# Patient Record
Sex: Female | Born: 1945 | Race: Black or African American | Hispanic: No | Smoking: Current every day smoker
Health system: Southern US, Community
[De-identification: ages and names within clinical notes are randomized; demographics above are authoritative.]

## PROBLEM LIST (undated history)

## (undated) DIAGNOSIS — C801 Malignant (primary) neoplasm, unspecified: Secondary | ICD-10-CM

## (undated) DIAGNOSIS — F0280 Dementia in other diseases classified elsewhere without behavioral disturbance: Secondary | ICD-10-CM

## (undated) DIAGNOSIS — G309 Alzheimer's disease, unspecified: Secondary | ICD-10-CM

## (undated) DIAGNOSIS — E119 Type 2 diabetes mellitus without complications: Secondary | ICD-10-CM

## (undated) DIAGNOSIS — F17200 Nicotine dependence, unspecified, uncomplicated: Secondary | ICD-10-CM

---

## 1997-08-02 ENCOUNTER — Other Ambulatory Visit: Admission: RE | Admit: 1997-08-02 | Discharge: 1997-08-02 | Payer: Self-pay | Admitting: *Deleted

## 1998-10-25 ENCOUNTER — Other Ambulatory Visit: Admission: RE | Admit: 1998-10-25 | Discharge: 1998-10-25 | Payer: Self-pay | Admitting: *Deleted

## 1999-06-24 ENCOUNTER — Emergency Department (HOSPITAL_COMMUNITY): Admission: EM | Admit: 1999-06-24 | Discharge: 1999-06-24 | Payer: Self-pay | Admitting: Emergency Medicine

## 1999-09-18 ENCOUNTER — Emergency Department (HOSPITAL_COMMUNITY): Admission: EM | Admit: 1999-09-18 | Discharge: 1999-09-18 | Payer: Self-pay | Admitting: Emergency Medicine

## 2000-01-16 ENCOUNTER — Other Ambulatory Visit: Admission: RE | Admit: 2000-01-16 | Discharge: 2000-01-16 | Payer: Self-pay | Admitting: *Deleted

## 2000-04-24 ENCOUNTER — Emergency Department (HOSPITAL_COMMUNITY): Admission: EM | Admit: 2000-04-24 | Discharge: 2000-04-24 | Payer: Self-pay | Admitting: *Deleted

## 2000-06-23 ENCOUNTER — Encounter (INDEPENDENT_AMBULATORY_CARE_PROVIDER_SITE_OTHER): Payer: Self-pay

## 2000-06-23 ENCOUNTER — Ambulatory Visit (HOSPITAL_COMMUNITY): Admission: RE | Admit: 2000-06-23 | Discharge: 2000-06-23 | Payer: Self-pay | Admitting: Gastroenterology

## 2001-03-17 ENCOUNTER — Other Ambulatory Visit: Admission: RE | Admit: 2001-03-17 | Discharge: 2001-03-17 | Payer: Self-pay | Admitting: *Deleted

## 2001-11-10 ENCOUNTER — Encounter: Payer: Self-pay | Admitting: Otolaryngology

## 2001-11-10 ENCOUNTER — Encounter: Admission: RE | Admit: 2001-11-10 | Discharge: 2001-11-10 | Payer: Self-pay | Admitting: Otolaryngology

## 2001-11-18 ENCOUNTER — Encounter: Payer: Self-pay | Admitting: Otolaryngology

## 2001-11-18 ENCOUNTER — Encounter: Admission: RE | Admit: 2001-11-18 | Discharge: 2001-11-18 | Payer: Self-pay | Admitting: Otolaryngology

## 2001-12-01 ENCOUNTER — Ambulatory Visit (HOSPITAL_BASED_OUTPATIENT_CLINIC_OR_DEPARTMENT_OTHER): Admission: RE | Admit: 2001-12-01 | Discharge: 2001-12-01 | Payer: Self-pay | Admitting: Otolaryngology

## 2001-12-01 ENCOUNTER — Encounter (INDEPENDENT_AMBULATORY_CARE_PROVIDER_SITE_OTHER): Payer: Self-pay | Admitting: *Deleted

## 2002-01-19 ENCOUNTER — Ambulatory Visit (HOSPITAL_COMMUNITY): Admission: RE | Admit: 2002-01-19 | Discharge: 2002-01-19 | Payer: Self-pay | Admitting: Otolaryngology

## 2002-01-19 ENCOUNTER — Encounter: Payer: Self-pay | Admitting: Otolaryngology

## 2002-03-03 ENCOUNTER — Encounter: Payer: Self-pay | Admitting: Urology

## 2002-03-03 ENCOUNTER — Encounter: Admission: RE | Admit: 2002-03-03 | Discharge: 2002-03-03 | Payer: Self-pay | Admitting: Urology

## 2002-04-12 ENCOUNTER — Encounter: Payer: Self-pay | Admitting: Surgery

## 2002-04-12 ENCOUNTER — Ambulatory Visit (HOSPITAL_COMMUNITY): Admission: RE | Admit: 2002-04-12 | Discharge: 2002-04-12 | Payer: Self-pay | Admitting: Surgery

## 2002-05-04 ENCOUNTER — Other Ambulatory Visit: Admission: RE | Admit: 2002-05-04 | Discharge: 2002-05-04 | Payer: Self-pay | Admitting: *Deleted

## 2002-07-28 ENCOUNTER — Ambulatory Visit (HOSPITAL_COMMUNITY): Admission: RE | Admit: 2002-07-28 | Discharge: 2002-07-28 | Payer: Self-pay | Admitting: Surgery

## 2002-07-28 ENCOUNTER — Encounter: Payer: Self-pay | Admitting: Surgery

## 2002-08-31 ENCOUNTER — Inpatient Hospital Stay (HOSPITAL_COMMUNITY): Admission: RE | Admit: 2002-08-31 | Discharge: 2002-09-07 | Payer: Self-pay | Admitting: Surgery

## 2002-08-31 ENCOUNTER — Encounter: Payer: Self-pay | Admitting: Surgery

## 2002-08-31 ENCOUNTER — Encounter (INDEPENDENT_AMBULATORY_CARE_PROVIDER_SITE_OTHER): Payer: Self-pay | Admitting: Specialist

## 2003-01-12 ENCOUNTER — Ambulatory Visit (HOSPITAL_COMMUNITY): Admission: RE | Admit: 2003-01-12 | Discharge: 2003-01-12 | Payer: Self-pay | Admitting: Surgery

## 2003-01-13 ENCOUNTER — Ambulatory Visit (HOSPITAL_COMMUNITY): Admission: RE | Admit: 2003-01-13 | Discharge: 2003-01-13 | Payer: Self-pay | Admitting: Surgery

## 2003-06-06 ENCOUNTER — Other Ambulatory Visit: Admission: RE | Admit: 2003-06-06 | Discharge: 2003-06-06 | Payer: Self-pay | Admitting: *Deleted

## 2004-02-03 ENCOUNTER — Ambulatory Visit (HOSPITAL_COMMUNITY): Admission: RE | Admit: 2004-02-03 | Discharge: 2004-02-03 | Payer: Self-pay

## 2005-03-29 ENCOUNTER — Encounter: Admission: RE | Admit: 2005-03-29 | Discharge: 2005-03-29 | Payer: Self-pay

## 2005-11-16 ENCOUNTER — Emergency Department (HOSPITAL_COMMUNITY): Admission: EM | Admit: 2005-11-16 | Discharge: 2005-11-16 | Payer: Self-pay | Admitting: Emergency Medicine

## 2007-01-02 ENCOUNTER — Encounter: Admission: RE | Admit: 2007-01-02 | Discharge: 2007-01-02 | Payer: Self-pay

## 2007-10-08 ENCOUNTER — Inpatient Hospital Stay (HOSPITAL_COMMUNITY): Admission: EM | Admit: 2007-10-08 | Discharge: 2007-10-14 | Payer: Self-pay | Admitting: Emergency Medicine

## 2007-10-08 ENCOUNTER — Ambulatory Visit: Payer: Self-pay | Admitting: Pulmonary Disease

## 2007-10-14 ENCOUNTER — Ambulatory Visit: Payer: Self-pay | Admitting: Vascular Surgery

## 2007-10-14 ENCOUNTER — Encounter (INDEPENDENT_AMBULATORY_CARE_PROVIDER_SITE_OTHER): Payer: Self-pay | Admitting: Internal Medicine

## 2007-11-30 ENCOUNTER — Encounter: Admission: RE | Admit: 2007-11-30 | Discharge: 2007-11-30 | Payer: Self-pay | Admitting: Endocrinology

## 2008-07-18 ENCOUNTER — Encounter: Admission: RE | Admit: 2008-07-18 | Discharge: 2008-07-18 | Payer: Self-pay

## 2009-07-13 ENCOUNTER — Encounter: Admission: RE | Admit: 2009-07-13 | Discharge: 2009-07-13 | Payer: Self-pay | Admitting: General Surgery

## 2010-05-05 IMAGING — CR DG ABD PORTABLE 1V
1 series · 1 of 1 positions shown · non-contrast
Comparison: 11/08/2007, [DATE] hours.

CLINICAL DATA: Status epilepticus.  Altered level of consciousness,
fever.  Hyperglycemia.

ABDOMEN - 1 VIEW

[view not recorded]
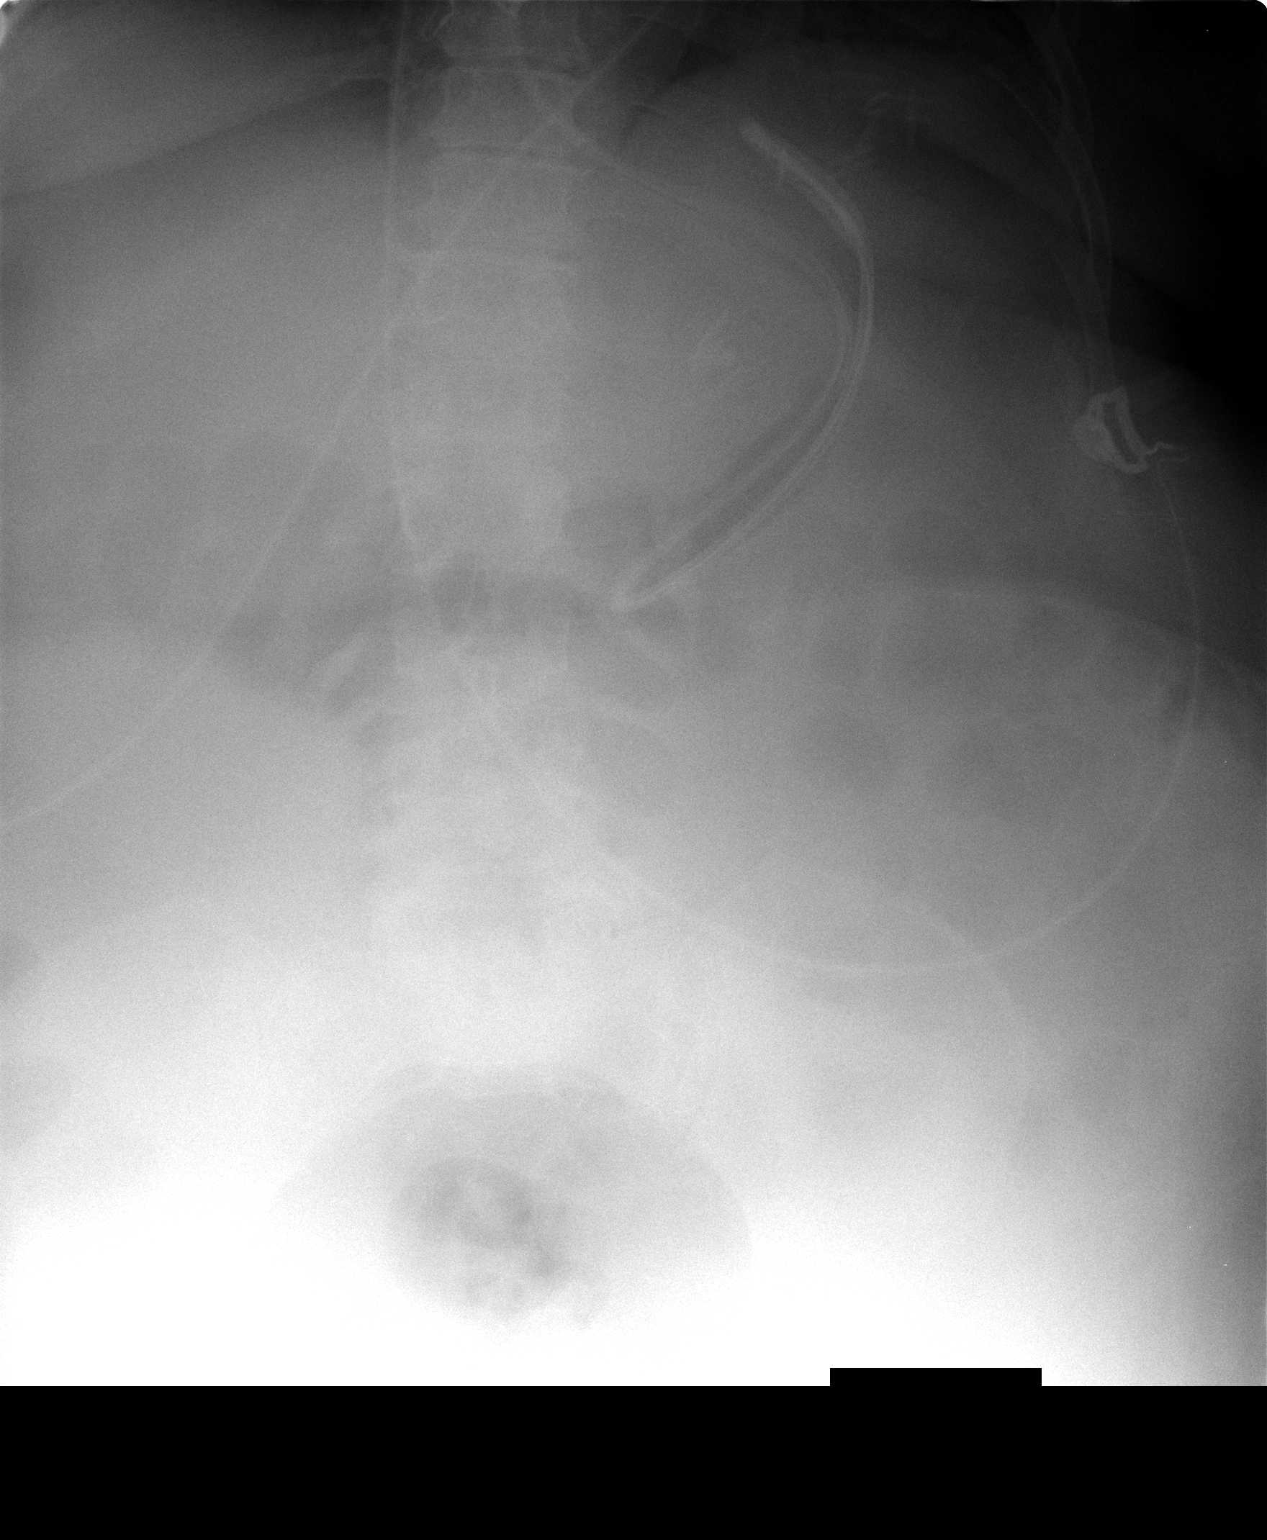

[1 of 1 positions shown; findings below may reference images not displayed]

FINDINGS: The feeding tube has been manipulated but the tip still
is directed at the gastric cardia, with the tube doubled back upon
itself within the stomach.
IMPRESSION: Feeding tube in stomach, with tip at gastric cardia.

## 2010-05-05 IMAGING — CR DG CHEST 1V PORT
1 series · 1 of 1 positions shown · non-contrast
Comparison: 10/11/2007

CLINICAL DATA: Status epilepticus, altered consciousness, fever,
hyperglycemia.

PORTABLE CHEST - 1 VIEW

[view not recorded]
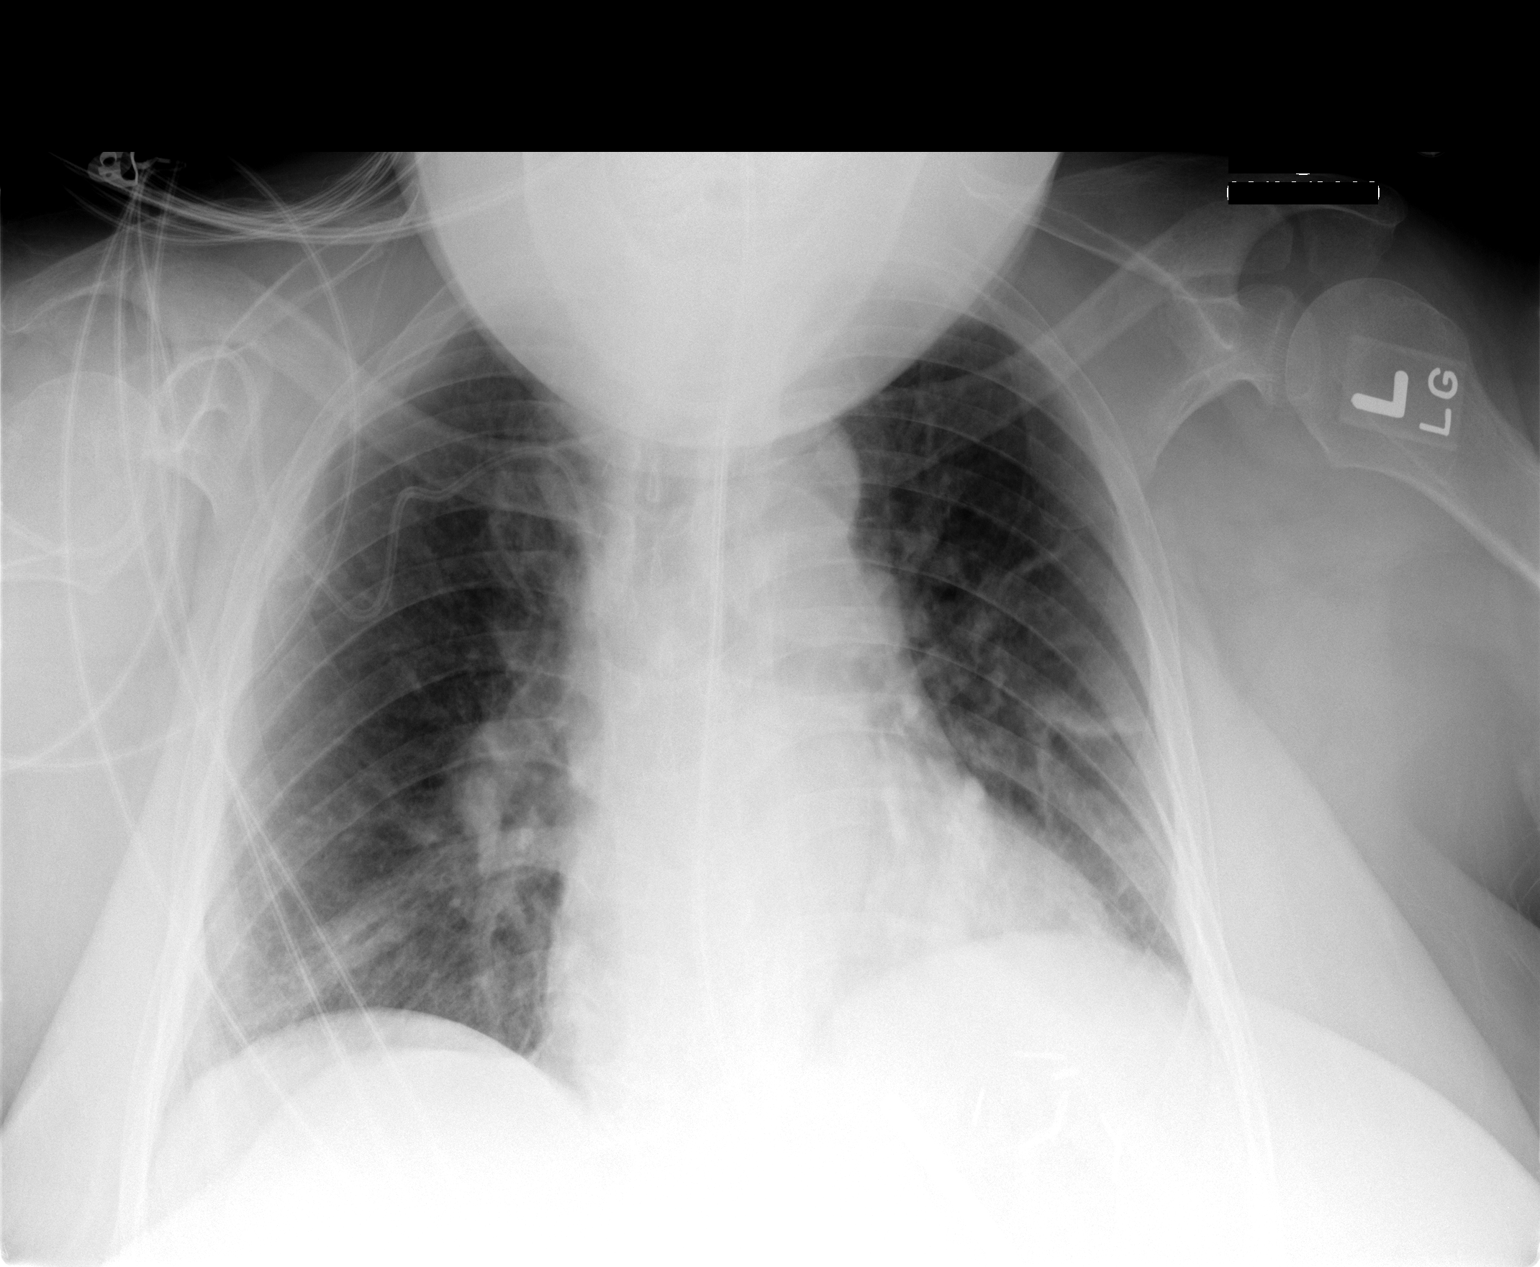

[1 of 1 positions shown; findings below may reference images not displayed]

FINDINGS: Feeding tube is present with the tip in the stomach.
Surgical changes noted in the left upper quadrant.  Scattered areas
of subsegmental atelectasis.  Right subclavian central line is
present with the tip in the mid SVC, unchanged from yesterday's
exam.  The endotracheal tube appears to been removed.  Exam is
mildly limited by projection.
IMPRESSION: 1.  Interval extubation.
2.  Low lung volumes.
3.  Bibasilar atelectasis.
4.  Feeding tube in stomach and right subclavian line unchanged.

## 2010-07-24 NOTE — H&P (Signed)
NAMESANJANA, FOLZ NO.:  [id number]   MEDICAL RECORD NO.:  [id number]          PATIENT TYPE:  INP   LOCATION:  1228                         FACILITY:  [facility]   PHYSICIAN:  Oley Balm. Sung Amabile, MD   DATE OF BIRTH:  1945/03/13   DATE OF ADMISSION:  10/08/2007  DATE OF DISCHARGE:                              HISTORY & PHYSICAL   ADMISSION DIAGNOSES:  1. Status epilepticus with decreased level of consciousness.  2. Ventilator-dependent respiratory failure.  3. Hyperglycemic, hyperosmolar state.  4. History of meningioma.  5. Fever, unclear etiology.  6. History of systemic lupus erythematous - no chronic prednisone      since February 2009.   HISTORY OF PRESENT ILLNESS:  Nancy Bailey is a 65 year old woman with a  complex past medical history.  Most relevant, she had a meningioma  diagnosed in 1994 and underwent craniotomy for resection of this at that  time.  The resection was incomplete and she also received radiation  therapy.  She subsequently had further surgery in approximately year  1996.  She has been followed closely at [facility]  with repeat CT scans of the head every 6 to 12 months since that time.  Her oncologist at [facility] is Dr. Amada Jupiter.  She was seen most  recently 2 days ago with an MRI performed at that time per the family.  The family informs me that Dr. Amada Jupiter contacted the patient and  told her that the MRI demonstrated no significant change in the  meningioma since prior studies.  The patient has suffered visual change  and headaches over the past couple of weeks.  She has had no fevers,  chills or sweats.  On the afternoon of this admission, she was in her  usual state of health.  Subsequently, no family members could contact  her.  Ultimately she was found with depressed level of consciousness and  EMS was dispatched to her home.  Per their reports she was found to be  seizing.  She was transported to  [facility] Emergency  Department where she was intubated for depressed level of consciousness.  She received anticonvulsants in the emergency department.  She was found  to be febrile at 102 degrees Fahrenheit and received ceftriaxone for  this.  She was found to be severely hyperglycemic and hyperosmolar and  was started on an insulin drip for this.   PAST MEDICAL HISTORY:  1. Most notable for meningioma as described above.  2. Reported history of lupus, followed by Dr. Phylliss Bob.  She has been on      prednisone much of the past decade or so.  The prednisone was      tapered off in February of this year, though she has had a couple      of Dosepaks since that time.  3. Hypertension.  4. 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  5. Chronic arthritis.  6. There is no prior history of diabetes.   SOCIAL HISTORY:  The patient is a  nonsmoker.  She lives independently.  She has suffered a recent loss of her daughter and has been notably  despondent over this since April of this year.  She has no history of  significant occupational or environmental exposures.   FAMILY HISTORY:  Noncontributory.   REVIEW OF SYSTEMS:  Per the family, there have been no fevers, chills,  sweats.  No nausea, vomiting or diarrhea.  No dysuria.  No change in  appetite.  No lower extremity edema or calf tenderness beyond her usual  mild fluid retention.   PHYSICAL EXAMINATION:  Temperature 102.0, blood pressure 105/60, pulse  80 and regular, respirations 18 on the ventilator.  Oxygen saturation  100%.  GENERAL:  In general, she is markedly obese, intubated and minimally  responsive.  HEENT: Reveals pinpoint pupils that react to light.  Otherwise  unremarkable.  NECK:  Supple without palpable adenopathy or thyromegaly.  Jugular  venous pulsations cannot be visualized.  CHEST:  Examination reveals diffuse scattered wheezes anteriorly.  No  findings of  consolidation are noted.  CARDIAC:  Exam reveals regular rate and rhythm with no murmurs.  ABDOMEN:  Markedly obese, soft, nontender with normal bowel sounds.  There is a well-healed surgical scar across the upper abdomen.  EXTREMITIES:  Reveal no clubbing, cyanosis or edema.  Distal pulses are  full and capillary refill is normal.   DATA:  This has been reviewed and is most notable for severe  hypernatremia and hyperglycemia.  Acid base status is normal.  Potassium  is 6.0.  CBC suggests that she is hemoconcentrated.  Chest x-ray reveals  no acute cardiac or pulmonary disease.  Central line placement is good  without pneumothorax.  Endotracheal tube has been repositioned and the  tip is in the mid trachea.   IMPRESSION:  1. Status epilepticus with altered mental status - she is receive      Dilantin load.  There does not appear to be ongoing seizure.  I      have requested that Neurology see her in consultation.  The      etiology of seizure, which is new onset, is uncertain, but most      likely is related to her prior meningioma and perhaps related to      profound metabolic disturbance and hyperosmolar state.  Although      she is febrile in the emergency department, I have a low index of      suspicion for meningitis.  I suspect the fever is postictal, based      on the absence of compelling history for meningitis prior to the      onset of this seizure.  2. Severe hyperglycemia and hyperosmolarity - no prior history of      diabetes, though given her body habitus and given the fact that she      has been on chronic steroids, I find it difficult to believe that      she has not had problems with hyperglycemia for some time and that      this problem was simply not detected.  3. Hyperkalemia - again this is related to medical therapy (she was      taking a potassium supplement), renal insufficiency and metabolic      disturbance.  This should resolve with insulin therapy and  volume      resuscitation.  4. Ventilator-dependent respiratory failure.  5. Morbid obesity.   PLAN:  She will be admitted to the intensive care unit  for ventilatory  support.  Dr. Vickey Huger, of Neurology has seen the patient to direct  neurologic evaluation and management.  She has already been loaded with  Dilantin.  We will use propofol for sedation to allow for daily wake up  assessment and neurologic evaluation.  In addition, I have written for  p.r.n. Ativan in the event of further seizure.  She was started on an  insulin drip in the emergency department and this will be continued per  the glucose stabilizer protocol.  Empiric antibiotics, Unasyn have been  initiated.  I failed to mention above that her MRI performed at [facility]  reportedly did demonstrate changes of sinusitis.  There is no evidence  of aspiration pneumonia on the current chest x-ray and history and  physical exam do not suggest any other infectious sources of fever.  Ultimately, the family wishes for her to be stabilized and transferred  to [facility], where she has received most of her care under the guidance of  Dr. Amada Jupiter, the Oncologist.  Of note, she was scheduled to be seen  by him in followup of the recent MRI on the day following this  admission.  I made it  clear to the family that it is often difficult to transport critically  ill the patient's and that it might be a couple or a few days before we  can make that transfer.  Lastly, I have initiated DVT prophylaxis with  mini dose heparin and stress ulcer prophylaxis with Protonix.      Oley Balm Sung Amabile, MD  Electronically Signed     DBS/MEDQ  D:  10/08/2007  T:  10/08/2007  Job:  [id number]

## 2010-07-24 NOTE — Op Note (Signed)
NAMEGEORGANA, ROMAIN NO.:  [id number]   MEDICAL RECORD NO.:  [id number]          PATIENT TYPE:  INP   LOCATION:  1228                         FACILITY:  [facility]   PHYSICIAN:  Oley Balm. Sung Amabile, MD   DATE OF BIRTH:  Mar 19, 1945   DATE OF PROCEDURE:  10/08/2007  DATE OF DISCHARGE:                               OPERATIVE REPORT   PROCEDURE:  Right subclavian central venous catheter placement.   INDICATION:  Vent-dependent respiratory failure with poor intravenous  access.   CONSENT:  This was obtained verbally from the family members.   PROCEDURE:  After obtaining verbal consent from family members, this  procedure was performed in the [facility] emergency room.  The patient  was prepped and draped in sterile manner using a full body drape.  Full  barrier precautions were undertaken through the course of the procedure.  The skin was cleaned with chlorhexidine swabs.  One percent lidocaine  was used for topical anesthesia.  An Arrow 30-cm triple-lumen catheter  was utilized.  The right subclavian vein was accessed on the first  stick, but on several attempts, the wire could not be passed and  appeared to be running up the IJ.  After 3 attempts at a median  approach, a lateral approach was undertaken, and the subclavian vein was  accessed with the introducer needle.  The wire was passed without  obstruction.  Seldinger technique was used to place the catheter over  the wire.  The catheter was sutured at 18 cm.  There was good blood  return in all 3 lumens.  The lumens were flushed with normal saline.  Follow-up chest x-ray demonstrated no pneumothorax and proper  positioning of the central venous catheter.      Oley Balm Sung Amabile, MD  Electronically Signed     DBS/MEDQ  D:  10/08/2007  T:  10/08/2007  Job:  [id number]

## 2010-07-24 NOTE — Discharge Summary (Signed)
[name], Nancy Bailey NO.:  [id number]   MEDICAL RECORD NO.:  [id number]          PATIENT TYPE:  INP   LOCATION:  1519                         [facility]ACILITY:  [facility]   PHYSICIAN:  Charlaine Dalton. Sherene Sires, MD, FCCPDATE OF BIRTH:  05-31-1945   DATE OF ADMISSION:  10/08/2007  DATE OF DISCHARGE:  10/14/2007                               DISCHARGE SUMMARY   ADDENDUM   DISCHARGE DIAGNOSES:  1. Altered mental status secondary to severe metabolic derangement,      hyperosmolar nonketotic coma, and seizure.  2. Hypertension.  3. Acute respiratory failure secondary to altered mental status      secondary to severe metabolic derangement, hyperosmolar nonketotic      coma, and seizure.  4. Profound hypernatremia with acute renal failure (resolved).  5. Obesity.  6. Diabetes type 2.  7. Left arm pain.   As mentioned in the prior discharge summary, Nancy Bailey complained of  left arm pain initiating at the wrist of the left arm.  Circulation and  motion were intact.  There was mild ecchymosis.  There was otherwise no  significant discoloration.  The pulses were strong.  There was brisk cap  refill distal to old arterial insertion site.  At any rate, we obtained  ultrasound imaging of the arterial and venous circulation to rule out  evidence of clot and hematoma.  The findings of this test demonstrated a  large thrombus initiating in the left cephalic vein at the level of the  wrist, extending up to the axilla.  The arterial circulation was noted  to be intact.  In light of these findings it was felt Nancy Bailey was  safe to be discharged to home with instructions to continue current  level of activity, elevate arm at rest.  Anticoagulation was deferred at  this point given 1) The patient's desire to go home and 2) A history of  known meningioma with a mild degree of edema.  We were concerned about  long-term anticoagulation with risk of intercerebral hemorrhage.  Therefore, will defer  this potential therapy to Dr. Amada Jupiter at [facility].  Upon time of discharge, again, Nancy Bailey  has met maximum benefit from inpatient care.  All other  discharge diagnoses are as mentioned above.  Again, will defer treatment  of venous thrombus to her oncology physician, particularly given our  concern for risk of hemorrhage, and low risk for pulmonary emboli from  upper extremity DVT.      Zenia Resides, NP      Charlaine Dalton. Sherene Sires, MD, [facility]  Electronically Signed    PB/MEDQ  D:  10/14/2007  T:  10/14/2007  Job:  [id number]   cc:   Elwyn Reach [facility]  [phone]

## 2010-07-24 NOTE — Discharge Summary (Signed)
Nancy Bailey, Nancy Bailey NO.:  [id number]   MEDICAL RECORD NO.:  [id number]          PATIENT TYPE:  INP   LOCATION:  1519                         FACILITY:  [facility]   PHYSICIAN:  Nancy Dalton. Sherene Sires, MD, FCCPDATE OF BIRTH:  17-Mar-1945   DATE OF ADMISSION:  10/08/2007  DATE OF DISCHARGE:  10/14/2007                               DISCHARGE SUMMARY   DISCHARGE DIAGNOSES:  1. Altered mental status secondary to severe metabolic derangements,      hyperosmolar nonketotic coma, and seizure.  2. Hypertension.  3. Acute respiratory failure secondary to #1.  4. Profound hypernatremia, with acute renal failure.  5. Obesity.  6. Diabetes type 2.   LABORATORY DATA UPON TIME OF DISCHARGE:  Date October 14, 2007:  Sodium  141, potassium 3.8, chloride 108, CO2 26, fasting glucose 228, BUN 8,  creatinine 0.79, calcium 8.7.  Date October 13, 2007:  Complete blood  count:  White blood cell count 9.4, hemoglobin 12.1, hematocrit 37,  platelet count 165.   CT of head obtained on October 08, 2007 demonstrates tumor of left ethmoid,  frontal, and orbital regions which may represent recurrent meningioma or  type of neoplasm.  The tumor is causing visible bony destruction and  extends into the orbit.  Also suggestion of subtle cerebral edema in the  left temporal and parietal lobes.  EEG obtained on October 09, 2007  demonstrates abnormal EEG with sharp transients emanating from the left  greater than right frontotemporal regions.  The recording suggests  irritability of the brain, with lower seizure threshold, no actual  epileptic discharges were noted.   BRIEF HISTORY:  A 65 year old woman with a complex medical history,  including meningioma diagnosed in 1994, followed at [facility] with Dr. Amada Bailey.  The patient had been complaining  of some visual changes and headaches for the last couple weeks, as well  as increased sleepiness.  No fevers, no chills or sweats.  The  afternoon  of admission, she was in her usual state of health.  Subsequently, she  was found by family after they were unable to contact her with altered  mental status.  The EMS was dispatched to her home.  Their report found  the patient to be actively seizing.  She was therefore transferred  emergently to [facility] emergency room.  Given her altered mental  status, she was emergently intubated for airway protection.  She was  found to have fever of 102 degrees Fahrenheit.  For this, she received  empiric ceftriaxone.  She was also found to be severely hyperglycemic  and hyperosmolar on initial laboratory data, with blood glucose above  800, an initial sodium of 158, with a serum osmolality of 369.  She was  given a loading dose of Dilantin, and transferred to the intensive care  for definitive therapy.   HOSPITAL COURSE:  1. Altered mental status, with severe toxic metabolic encephalopathy      in the setting of severe metabolic derangements, postictal state      following seizure, in the setting of hyperosmolar nonketotic coma.  As previously mentioned, Nancy Bailey was admitted to the intensive      care on full mechanical ventilation following witnessed seizure by      EMS.  Because of this, she was placed on mechanical ventilation for      airway protection.  A CT of the head was obtained, with the above      findings noted.  The patient was placed on anticonvulsant therapy.      Neurology was consulted.  She was initially placed on Dilantin, but      later transitioned to Keppra at the discretion of neurology      services.  An EEG was obtained, with the results noted above.  A      dialogue with Dr. Amada Bailey at [facility] was      obtained for further background information.  Ultimately, the      patient's mental status rapidly improved, along with correction of      hypernatremia and fluid volume status.  She was found to have a      profound  hyperosmolar state in the setting of dehydration and      hyperglycemia.  Her glycemic control was aggressively attended to,      and ultimately her mental status returned to baseline.  Upon time      of discharge, she will be discharged to home on 1 mg Decadron daily      in the setting of known cerebral edema, Keppra twice daily at 750      mg, will defer further dosing and management at the discretion of      [facility], and instructions to no longer drive      for minimally the next 6 months, given recent seizure activity.  2. Acute respiratory failure secondary to problem #1.  The patient was      placed on mechanical ventilation.  This was initiated on September 11, 2007.  She was successfully extubated on October 11, 2007.  Her      breath sounds are clear upon time of discharge, her x-ray is      without abnormality, and she has had no pulmonary sequelae from      this.  No specific followup recommended.  3. Profound hypernatremia secondary to free water deficit, and      resultant renal failure.  For this, Nancy Bailey received aggressive      volume resuscitation, and free water supplementation.  Upon time of      discharge, her sodium has been regulated, as has her serum      creatinine normalized.  She will now be discharged to home on an      aggressive twice-daily basal insulin therapy regimen, which should      assist with further risk of HHNK.  4. Diabetes type 2, with hyperosmolar nonketotic coma.  This is as      noted above.  Nancy Bailey did present with a glucose above 800.  Her      glucose fasting is now 225.  No doubt, this is exacerbated by      dosing of Decadron.  She is obese, does have room for lifestyle      adjustments, and continued dietary alteration.  Extensive diabetes      education was undergone.  Given the patient's visual deficits, she      will be to send home with a SoloStar Lantus pen  with twice-daily      Lantus dosing.  She was  instructed to check blood glucose 4 times a      day and record this.  She will need further titration of and her      insulin should she require persistent Decadron dosing.  5. Left arm pain.  The left arm at the radial insertion site of an      arterial catheter is tender to palpation.  She does have      circulation and motion intact.  She is able to move.  There is no      significant discoloration, and there are strong pulses, with brisk      capillary refill distal to site.  At any rate, given the patient      did have arterial catheterization in the left radial artery, we      will obtain ultrasound imaging of the arterial circulation, as well      as venous circulation to rule out evidence of clot or hematoma.   DISCHARGE INSTRUCTIONS:  1. Tramadol 50 mg 4 times a day as needed.  2. Singulair 10 mg daily.  3. Robaxin 500 mg q.6 h. as needed.  4. Fexofenadine 180 mg daily.  5. Vesicare 10 mg daily.  6. Decadron 1 mg tablet daily.  7. Toprol-XL 25 mg tablet daily.  8. Lantus insulin 20 units injected via SoloStar pen q.12 h.,      instructed to take one dose in the morning and one in the night.  9. Keppra 750 mg p.o. twice daily.   FOLLOWUP:  1. She is instructed see Dr. Amada Bailey in the next 2 days at [facility].  The patient will set this up.  2. Additionally she was instructed to see her primary care Harini Dearmond,      Dr. Phylliss Bob, here in [place] in the next 2 weeks, and the patient      states she will and arrange this appointment as well.   ACTIVITY:  The patient was instructed that she should not drive for  minimally the next 6 months, and this can be reevaluated following  prolonged time without recurrent seizure.  She was instructed to eat a  low-sodium, no concentrated sweets diet.  She was also instructed to  avoid refined sugars.  She was instructed she can increase activity as  tolerated, and to report to the nearest emergency room should  above  symptoms recur.   DISPOSITION:  Ms. Swetz has met maximum benefit from inpatient stay.  She will be discharged to home with the followups as noted.      Zenia Resides, NP      Nancy Dalton. Sherene Sires, MD, [facility]  Electronically Signed    PB/MEDQ  D:  10/14/2007  T:  10/14/2007  Job:  [id number]   cc:   Areatha Keas, M.D.  Fax: [phone]   Dr., Kateri [facility] [place]  Phone [phone]

## 2010-07-24 NOTE — Procedures (Signed)
EEG#:  [id number]   HISTORY:  This is a 65 year old patient presenting with status  epilepticus, altered mental status, fever, hyperglycemia, and  hyperosmolar state with a history of meningioma, status post craniotomy,  and history of lupus.  The patient is being evaluated for the seizures.   CURRENT MEDICATIONS:  Cipro, Decadron, Ventolin inhaler, Lantus insulin,  Atrovent, Protonix, Dilantin, vancomycin, fentanyl, Apresoline, Ativan,  and Diprivan.   This is a portable EEG recording.  Surgical scar on the head noted on  the left frontotemporal area.   EEG classification, dysrhythmia grade 2, left greater than right  frontotemporal.   DESCRIPTION OF RECORDING:  Background rhythm of this recording consists  of a moderately well modulated medium amplitude 9 Hz background activity  that is reactive.  As the record progresses, photic stimulation and  hyperventilation were not performed.  The patient appears to have  rudimentary sleep spindles seen at times consistent with sedated sleep  and some overlying beta frequency activity is also noted.  The patient  occasionally will have sharp transients emanating from the left greater  than right frontotemporal area.  At no time during recording did there  appear to be evidence of actual spike or spike wave discharges or  persistent slowing.  EKG monitor shows no evidence of cardiac rhythm  abnormalities; heart rate of 102.   IMPRESSION:  This is an abnormal EEG recording due to sharp transients  emanating from the left greater than right frontotemporal regions.  This  recording suggests some irritability of the brain with a lowered seizure  threshold, although no actual epileptic discharges were noted.      Marlan Palau, M.D.  Electronically Signed     [id number]  D:  10/09/2007 14:44:15  T:  10/10/2007 05:42:28  Job #:  [id number]

## 2010-07-24 NOTE — Consult Note (Signed)
NAMEHARRY, BARK NO.:  [id number]   MEDICAL RECORD NO.:  [id number]          PATIENT TYPE:  INP   LOCATION:  0108                         FACILITY:  [facility]   PHYSICIAN:  Melvyn Novas, M.D.  DATE OF BIRTH:  Jul 11, 1945   DATE OF CONSULTATION:  10/08/2007  DATE OF DISCHARGE:                                 CONSULTATION   Dr. Sandrea Hughs is the attending.   PRIMARY CARE PHYSICIAN:  Dr. Phylliss Bob, rheumatology.   She presents on October 08, 2007 at 5:30 in status epilepticus.   The patient is a 65 year old African American female with a history of  resected and recurrent meningioma who was also followed for lupus  erythematosus by Dr. Phylliss Bob.  She is to be on chronic steroids but in  April of this year weaned off and has been followed intermittently,  apparently requiring some Dosepak.  She has seen further Dr. Bjorn Pippin.  She has seen Darnell Level, Careers adviser, for retrogastric peritoneal mass in  2004.  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  She  has a history of these meningiomata for over 12 years.  A recurrence  occurred at the end of last year, and she has since then had fractioned  radiation and stereotactic radiosurgery.   Ms. Soave is described by her family as a pleasant well-appearing 65-  year-old [place] American female in no acute distress but morbidly  obese.  She was hypertensive during her last visit in June of this year  at the [facility], August 14, 2007, with a blood pressure of  155/81.  Her weight was 123 kg at a size of 5 feet 2 inches.  The patient has a history of left periorbital edema and proptosis  related to her meningioma.  She presented here in status epilepticus and  was taken by EMS to the emergency room at [facility].  Apparently, [facility] ER today is not able to assist more  patients, even if their chief complaint  is neurologic.  The patient's  family states that she developed over the last 2 months some personality  changes.  She has been perseverating.  She seems to forget a lot.  She  appears slightly disoriented.  She describes that she feels always she has a dry mouth, has severe  thirst and had polyuria over the last month.  She also had blurred  vision.  Her physician at [facility] just saw her last week for an eye examine, her  daughter states, and she states that she was told that she had healthy  eyes to her family members.  Today, when she did not respond to initial  IV benzodiazepines and then received Dilantin IV, she required  intubation after the repeated doses of benzodiazepines were given and is  now on the critical care team.  The consult was called at 8 p.m. by Dr.  Sung Amabile to see the patient for further seizure management.    The patient is also severly hyperglycemic at 847ng/ml.  This is a new  finding as the patient was supposingly never diagnosed with diabetes..  The patient appeared restless and needed sedation to protect her airway  and the intubation.  A CT of the head was obtained and apparently  negative for bleed.  The CT was a noncontrast study.   Further laboratory results today show a white blood cell count 7.4, a  hemoglobin and hematocrit of 15.8 and 47.8, a platelet count showed  platelet clumps probably due to hyperosmolality, and therefore, it was  not corrected.  Sodium 149, potassium 6, chloride 114, carbon dioxide  27, BUN was 29, creatinine was 1.5, calcium was 9.3.  Salicylates were  not found.  A tox screen was not performed.  The patient is a full code.   Endotracheal tube was in the right mainstem bronchus.  Lungs are clear  to auscultation.  The patient has not aspirated.  She is obese but does  not show peripheral edema.  She does not show clubbing or cyanosis.  She  is not diaphoretic.  Vital signs are relatively stable with a blood  pressure now of  150/78.  Heart rate is still 110, but in normal sinus  rhythm.  There is no cardiac arrhythmia noticed.  Respiratory rate was  18, breathing above the vent rate as set.   FAMILY HISTORY:  The patient just lost her daughter in April of this  year.  I do not know if this was due to an accident or a long-time  illness.  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  The family  here at the bedside is apparently nephews, nieces and a sister.   The patient received Dilantin, phenytoin, a load of 1.8 g IV.  This is  not fosphenytoin by the way.  Medications further included multiple  doses of Ativan, finally propofol and phosphenitoin, initially to  facilitate the intubation.  Upon the request of the family, the  neurosurgeon, Dr. Petra Kuba, at [facility] was called and  stated that if the family wants the patient to be transferred he would  be accepting the patient if stabilized.   The patient is unconscious.  She is not following commands.  She is  moving her arms.  She is trying to fight the vent.  She is biting the  tube.  She is atraumatic.  She has no scleral icterus.  No carotid bruit.  Deep tendon reflexes are  attenuated.  She has bilateral upgoing toes here today.  Again, sensory  exam, coordination exam, or gait and station had of course to be  deferred.   ASSESSMENT:  Status epilepticus, currently controlled under Dilantin.  At the time I was called to the patient's bedside, she was no longer  actively seizing but is intubated and sedated.   PLAN:  Continue vent care for the night, probably extubate tomorrow  morning.  The patient should be continued on Dilantin as a maintenance  dose of 100 mg IV t.i.d. as long as she cannot take p.o. medication.  Otherwise, she can be converted to p.o.  She will be on a Glucomander.  The critical care ICU orders were written by Dr. Sung Amabile for the primary  admitting team.  Follow-up will be provided tomorrow on October 09, 2007 by  our P.A. and physician on call.      Melvyn Novas, M.D.  Electronically Signed     CD/MEDQ  D:  10/08/2007  T:  10/08/2007  Job:  [id number] 

## 2010-07-27 NOTE — Op Note (Signed)
NAME:  Nancy Bailey, ROANHORSE NO.:  [id number]   MEDICAL RECORD NO.:  [id number]                   PATIENT TYPE:  OUT   LOCATION:  XRAY                                 FACILITY:  [facility]   PHYSICIAN:  Velora Heckler, M.D.                DATE OF BIRTH:  07-09-1945   DATE OF PROCEDURE:  08/31/2002  DATE OF DISCHARGE:  07/28/2002                                 OPERATIVE REPORT   PREOPERATIVE DIAGNOSIS:  Cystic neoplasm of the pancreas.   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   OPERATION PERFORMED:  Exploratory laparotomy with left adrenalectomy.   SURGEON:  Velora Heckler, M.D.   ASSISTANT:  Gita Kudo, M.D.   ANESTHESIA:  General per Dr. Kipp Brood.   ESTIMATED BLOOD LOSS:  .   PREPARATION:  Betadine.   COMPLICATIONS:  None.   INDICATIONS FOR PROCEDURE:  The patient is a 65 year old black female who  was seen initially in January of 2004 for cystic neoplasm of the pancreas.  This was found incidentally when CT scan of the abdomen was obtained as a  work-up for microhematuria.  The patient's surgical exploration was delayed  due to the need for treatment for recurrent meningioma at [facility].  The patient was cleared for surgery in early June and is  now brought to the operating room for exploration.   DESCRIPTION OF PROCEDURE:  The procedure was done in operating room #1  at  the [facility]. [facility].  The patient was brought to the  operating room and placed in supine position on the operating room table.  Following preparation by anesthesia, the abdomen was prepped and draped in  the usual strict aseptic fashion.  After ascertaining that an adequate level  of anesthesia had been obtained, a left subcostal incision was made with a  #10 blade.  Dissection was carried down through the subcutaneous tissues.  The abdominal wall was incised and fascia and underlying muscles  were  divided with the electrocautery.  The peritoneal cavity was entered  cautiously.  The patient was placed in reverse Trendelenburg.  A Thompson  retractor was placed for exposure.  The abdomen was explored.  Dissection  was begun by elevating the omentum of the transverse colon.  The plane  separating the transverse mesocolon from the posterior wall of the stomach  was entered and the lesser sac was opened widely.  Some bridging vessels  were divided between Kelly clamps and ligated with 2-0 silk ties.  Next the  splenic flexure of the colon was completely mobilized.  Adhesions around the  spleen were incised with the electrocautery.  Thompson retractor was  repositioned.  Short gastric vessels were ligated in continuity with 2-0  silk ties and divided also using large Ligaclips for hemostasis.  At this  point the entire spleen, stomach and distal  pancreas had been mobilized.  On  thorough examination by visual inspection and palpation, no obvious mass  lesion of the pancreas could be appreciated.  Review again of the CT scan  and MRI scan demonstrated a lesion on the dorsal surface of the pancreas  immediately adjacent to the superior portion of the left kidney.  On further  exploration, a mass was indeed found just anterior and medial to the hilum  of the left kidney.  This was approximately a 4 cm firm rubber mass.  The  pancreas was completely elevated  off of the mass and did not appear to be  involved by the mass.  The mass appeared to originate from the left adrenal  gland. Therefore, with careful dissection, the left adrenal gland was  dissected out.  The mass was mobilized.  Venous tributaries were ligated at  the inferior aspect of the mass in continuity with 2-0 silk ties and  divided.  Smaller venous tributaries were divided between medium and large  Ligaclips.  the mass was carefully dissected out and completely excised.  The wound was packed with laparotomy sponge.  The  tumor appeared to be  completely resected with clear surgical margin.  There did appear to be a  small area of transection of the underlying adrenal gland.  The mass was  submitted fresh to pathology where Dr. Jennye Moccasin reviewed the specimen and  notified us that this represented a benign adrenal adenoma.  Good hemostasis  was obtained with large Ligaclips and Surgicel.  After packing the tumor bed  for several minutes.  There was good hemostasis noted.  Small piece of the  omentum was inserted into the area where the tumor had been resected.  The  area around the spleen was irrigated and good hemostasis was noted.  Thompson retractor was removed.  The left subcostal incision was closed in  two layers with running #1 Novofil suture.  Subcutaneous tissues were  irrigated.  Hemostasis obtained with electrocautery.  Skin was closed with  stainless steel staples.  Sterile dressings were applied.  The patient was  awakened from anesthesia and brought to the recovery room in stable  condition.  The patient tolerated the procedure well.                                                Velora Heckler, M.D.    TMG/MEDQ  D:  08/31/2002  T:  09/01/2002  Job:  [id number]   cc:   Excell Seltzer. Annabell Howells, M.D.  [address]  [place]  [place] [address]  Fax: [phone]   Areatha Keas, M.D.  [address]  [address]  [place]  [place] [address]  Fax: [phone]

## 2010-07-27 NOTE — Procedures (Signed)
[facility]  Patient:    Nancy Bailey, Nancy Bailey                       MRN: [id number] Proc. Date: 06/23/00 Adm. Date:  [id number] Disc. Date: [id number] Attending:  Harrel Carina CC:         Helene Kelp, M.D.   Procedure Report  OPERATION:  Colonoscopy with polypectomy.  INDICATIONS FOR PROCEDURE:  Possible family history of colon cancer in a first-degree relative.  DESCRIPTION OF PROCEDURE:  The patient was placed in the left lateral decubitus position and placed on the pulse monitor with continuous low-flow oxygen delivered by nasal cannula.  She was sedated with 60 mg IV Demerol and 6 mg of IV Versed.  The Olympus video colonoscope was inserted into the rectum and advanced to the cecum, confirmed by transillumination of McBurneys point and visualization of the ileocecal valve and appendiceal orifice.  The prep was good.  The cecum and ascending colon appeared normal with no masses, polyps, diverticula, or other mucosal abnormalities.  Within the transverse colon there were seen two small 8 mm polyps that were fulgurated by hot biopsy.  These were sent in the same specimen container.  The remainder of the transverse, descending, and sigmoid colon appeared normal with no further polyps, masses, diverticula, or other mucosal abnormalities.  Within the rectum there was seen a 1.2 cm pedunculated polyp at 12 cm, and this was removed by snare.  A second, slightly smaller, 1.0 cm pedunculated polyp was seen at 8 cm from the anus and removed by snare and sent with the first rectal polyp.  The remainder of the rectum appeared normal.  The colonoscope was then withdrawn, and the patient returned to the recovery room in stable condition. She tolerated the procedure well, and there were no immediate complications.  IMPRESSION:  Transverse and rectal colon polyps, otherwise normal colonoscopy.  PLAN:  Await histology for determination of interval for next  surveillance colonoscopy and to rule out malignancy in the rectal polyps. DD:  06/23/00 TD:  06/23/00 Job: [id number]

## 2010-07-27 NOTE — Discharge Summary (Signed)
NAME:  Nancy Bailey, Nancy Bailey                          ACCOUNT NO.:  [id number]   MEDICAL RECORD NO.:  [id number]                   PATIENT TYPE:  INP   LOCATION:  3020                                 FACILITY:  [facility]   PHYSICIAN:  Velora Heckler, M.D.                DATE OF BIRTH:  12-31-1945   DATE OF ADMISSION:  08/31/2002  DATE OF DISCHARGE:  09/07/2002                                 DISCHARGE SUMMARY   REASON FOR ADMISSION:  Retroperitoneal mass.   HISTORY OF PRESENT ILLNESS:  The patient is a 65 year old black female, seen  in initially in January of 2004 for what appeared to be a cystic neoplasm of  the pancreas. This had been found incidentally on a CT scan during a workup  for microhematuria.  Treatment was delayed due to the need for treatment of  recurrent meningioma at [facility].  The patient returns  to [facility] at this time for treatment of cystic neoplasm of the pancreas.   HOSPITAL COURSE:  The patient was admitted on June 22, and taken directly to  the operating room where she underwent exploration. Pancreas was found to be  normal. The mass in question was found to be located in the left adrenal  gland.  The patient underwent resection of the mass.  Frozen section  demonstrated adrenal mass consistent with adrenal adenoma.  Final pathology  showed an adrenal cortical neoplasm consistent with adrenal cortical  adenoma.  It measured 4.7 cm in greatest dimension. There was no sign of  malignancy.  Postoperatively, the patient did well.  She remained  hemodynamically stable. She had gradual resolution of her ileus and her diet  was slowly advanced.  Incision healed without complications.  Following full  return of bowel function, the patient was prepared for discharge home on the  7th postoperative day.   PLAN:  The patient is discharged home on September 07, 2002, in good condition.  Tolerating a regular diet and ambulating independent. The patient will  be  seen back in my office at [facility] in two weeks.   DISCHARGE MEDICATIONS:  Percocet as needed for pain and other medications as  per usual.   FINAL DIAGNOSES:  Adrenal cortical adenoma.   CONDITION ON DISCHARGE:  Good.                                               Velora Heckler, M.D.    TMG/MEDQ  D:  09/23/2002  T:  09/23/2002  Job:  [id number]   cc:   Excell Seltzer. Annabell Howells, M.D.  [address]  [place]  [place] [address]  Fax: [phone]   Areatha Keas, M.D.  [address]  [address]  [place]  [place] [address]  Fax: [phone]    cc:   Excell Seltzer. Annabell Howells, M.D.  [address]  [place]  [place] [address]  Fax: [phone]   Areatha Keas, M.D.  [address]  [address]  [place]  [place] [address]  Fax: [phone]

## 2010-07-27 NOTE — Op Note (Signed)
NAME:  Nancy Bailey, Nancy Bailey                          ACCOUNT NO.:  [id number]   MEDICAL RECORD NO.:  [id number]                   PATIENT TYPE:  AMB   LOCATION:  DSC                                  FACILITY:  [facility]   PHYSICIAN:  Kristine Garbe. Ezzard Standing, M.D.         DATE OF BIRTH:  1945-06-19   DATE OF PROCEDURE:  12/01/2001  DATE OF DISCHARGE:                                 OPERATIVE REPORT   PREOPERATIVE DIAGNOSIS:  Chronic sinus disease with bilateral frontal  sinusitis and bilateral ethmoid sinus disease, bilateral sphenoid sinus  disease with complete opacification and bilateral maxillary sinus disease.   POSTOPERATIVE DIAGNOSES:  Chronic sinus disease with bilateral frontal  sinusitis and bilateral ethmoid sinus disease, bilateral sphenoid sinus  disease with complete opacification and bilateral maxillary sinus disease.   OPERATION:  Functional endoscopic sinus surgery with bilateral total  ethmoidectomy, bilateral maxillary osteal enlargement, bilateral  sphenoidotomies, and bilateral nasofrontal duct exploration.   SURGEON:  Kristine Garbe. Ezzard Standing, M.D.   ANESTHESIA:  General endotracheal.   COMPLICATIONS:  None.   FINDINGS:  On the left side, the patient had a mass close to nasofrontal  duct region.  There was a question whether this represented a mucocele  versus meningocele.  We will need a followup MRI scan to better evaluate  this.   BRIEF CLINICAL NOTE:  This patient is a 65 year old female who has had  previous history of meningioma, status post left craniotomy.  Her more  recent problems included more sinus problems with decreased sense of smell,  congestion, postnasal drainage, and coughing.  She has been on antibiotics  off and on for over four months now, and on her most recent CT scan, she  still had fairly extensive disease within the sphenoid and frontal sinuses  and to a lesser degree within the ethmoid and maxillary sinus region.  She  is taken to the  operating room at this time for functional endoscopic sinus  surgery.  She has a significant history of meningioma, status post left  craniotomy.   DESCRIPTION OF PROCEDURE:  After adequate endotracheal anesthesia, the nose  was prepped with cotton pledgets soaked in Afrin, and the middle meatus area  was injected with Xylocaine with epinephrine for hemostasis.  The Insta-  Track was positioned and calibrated.  First, the right ethmoid region was  operated on.  Using the microdebrider, the anterior and posterior ethmoid  cells were opened up.  The patient had a large agger nasi cell on the right  side.  This was opened up widely, as was the right maxillary ostia, which  was inferior and posterior to this.  More posteriorly on the right side, the  posterior ethmoid cells were opened up, and then using the Insta-Track, the  sphenoid sinus on the right side was identified, and sphenoidotomy was  performed on the right side.  The mucosa within the right sphenoid sinus was  very thickened,  but there was no purulence.  Next, the left side was  approached.  Again using a microdebrider, the anterior and posterior ethmoid  cells were opened up, as was the left sphenoid sinus.  A sphenoidotomy was  performed again using the Insta-Track to identify boundaries.  Again, the  left sphenoid mucosa was very thickened and almost polypoid, but there was  no gross mucopurulent drainage and no evidence of active disease, more just  polypoid thickening.  Following this, the nasal fold area was explored.  Of  note on the left side, there is a very thick-walled, white mass just  posterior to the nasal frontal area, arising from the anterior ethmoid area  superiorly.  With visualization of this with the endoscopes, the thickness  of the wall of this mass may be concerning that this may represent a  meningocele.  It was soft and nonpulsatile but had a very thick, white wall.  On review of the CT scan, the ethmoid  area was a little bit abnormally  shaped and very rounded.  I suspect this either presents a mucocele versus a  meningocele.  The white-walled mass was exposed but was not opened up.  We  will probably require an MRI scan postoperatively to better evaluate this  and may require removal or fixing down the road.  The frontal sinus duct was  identified just anteriorly-superiorly to this, and this was opened up.  The  ethmoid cells around this mass were opened up.  The maxillary ostia on the  left side likewise was opened up.  The mucosa was very thickened, but there  was no gross mucopurulence.  This completed the procedure.  Kingsley sinus  packs were placed bilaterally and hydrated with Xylocaine with epinephrine.  The patient received 1 gm of Ancef IV preoperatively.  The patient was  subsequently awakened from anesthesia and transferred to the recovery room,  postoperatively doing well.   DISPOSITION:  The patient is discharged home later this morning on Keflex  500 b.i.d. for 10 days, Tylenol and Darvocet-N 100 1-2 q. 4 hours p.r.n.  pain.   FOLLOW UP:  Follow up in my office in three days to have the Kingsley sinus  packs removed.                                               Kristine Garbe. Ezzard Standing, M.D.    CEN/MEDQ  D:  12/01/2001  T:  12/02/2001  Job:  [id number]   cc:   Briant Sites, MD

## 2010-12-07 LAB — CBC
HCT: 43
HCT: 47.8 — ABNORMAL HIGH
HCT: 36.9
HCT: 37
HCT: 37.5
HCT: 39.3
Hemoglobin: 15.8 — ABNORMAL HIGH
Hemoglobin: 12.1
Hemoglobin: 12.8
MCV: 84.4
MCV: 84.6
Platelets: 248
Platelets: 143 — ABNORMAL LOW
Platelets: 149 — ABNORMAL LOW
Platelets: 152
RDW: 18.6 — ABNORMAL HIGH
RDW: 17.8 — ABNORMAL HIGH
RDW: 18 — ABNORMAL HIGH
RDW: 18.4 — ABNORMAL HIGH
RDW: 18.5 — ABNORMAL HIGH
WBC: 15.5 — ABNORMAL HIGH
WBC: 7.4
WBC: 11.8 — ABNORMAL HIGH
WBC: 9.4

## 2010-12-07 LAB — GLUCOSE, CAPILLARY
Glucose-Capillary: 135 — ABNORMAL HIGH
Glucose-Capillary: 140 — ABNORMAL HIGH
Glucose-Capillary: 155 — ABNORMAL HIGH
Glucose-Capillary: 189 — ABNORMAL HIGH
Glucose-Capillary: 222 — ABNORMAL HIGH
Glucose-Capillary: 256 — ABNORMAL HIGH
Glucose-Capillary: 262 — ABNORMAL HIGH
Glucose-Capillary: 286 — ABNORMAL HIGH
Glucose-Capillary: 314 — ABNORMAL HIGH
Glucose-Capillary: 113 — ABNORMAL HIGH
Glucose-Capillary: 116 — ABNORMAL HIGH
Glucose-Capillary: 120 — ABNORMAL HIGH
Glucose-Capillary: 121 — ABNORMAL HIGH
Glucose-Capillary: 123 — ABNORMAL HIGH
Glucose-Capillary: 130 — ABNORMAL HIGH
Glucose-Capillary: 135 — ABNORMAL HIGH
Glucose-Capillary: 140 — ABNORMAL HIGH
Glucose-Capillary: 144 — ABNORMAL HIGH
Glucose-Capillary: 147 — ABNORMAL HIGH
Glucose-Capillary: 149 — ABNORMAL HIGH
Glucose-Capillary: 151 — ABNORMAL HIGH
Glucose-Capillary: 166 — ABNORMAL HIGH
Glucose-Capillary: 167 — ABNORMAL HIGH
Glucose-Capillary: 172 — ABNORMAL HIGH
Glucose-Capillary: 182 — ABNORMAL HIGH
Glucose-Capillary: 190 — ABNORMAL HIGH
Glucose-Capillary: 196 — ABNORMAL HIGH
Glucose-Capillary: 205 — ABNORMAL HIGH
Glucose-Capillary: 226 — ABNORMAL HIGH
Glucose-Capillary: 227 — ABNORMAL HIGH
Glucose-Capillary: 232 — ABNORMAL HIGH
Glucose-Capillary: 259 — ABNORMAL HIGH
Glucose-Capillary: 292 — ABNORMAL HIGH
Glucose-Capillary: 294 — ABNORMAL HIGH
Glucose-Capillary: 306 — ABNORMAL HIGH
Glucose-Capillary: 96
Glucose-Capillary: 98

## 2010-12-07 LAB — BLOOD GAS, ARTERIAL
Acid-Base Excess: 1.5
Acid-Base Excess: 1.6
Acid-Base Excess: 2.5 — ABNORMAL HIGH
Bicarbonate: 25.4 — ABNORMAL HIGH
Drawn by: 129801
Drawn by: 30575
FIO2: 0.3
FIO2: 0.3
MECHVT: 600
MECHVT: 500
MECHVT: 500
O2 Saturation: 97.5
O2 Saturation: 99.8
O2 Saturation: 93.2
O2 Saturation: 95.1
O2 Saturation: 97.1
PEEP: 5
PEEP: 5
PEEP: 0.5
PEEP: 5
PEEP: 5
Patient temperature: 99.8
Patient temperature: 100.1
Patient temperature: 98.6
Patient temperature: 98.6
RATE: 14
RATE: 14
RATE: 14
RATE: 14
TCO2: 21.9
pCO2 arterial: 45
pCO2 arterial: 32 — ABNORMAL LOW
pH, Arterial: 7.394
pH, Arterial: 7.489 — ABNORMAL HIGH
pO2, Arterial: 358 — ABNORMAL HIGH

## 2010-12-07 LAB — COMPREHENSIVE METABOLIC PANEL
ALT: 28
AST: 43 — ABNORMAL HIGH
Albumin: 2.2 — ABNORMAL LOW
Albumin: 2.5 — ABNORMAL LOW
Alkaline Phosphatase: 92
Alkaline Phosphatase: 99
BUN: 13
Chloride: 108
GFR calc Af Amer: >60
Glucose, Bld: 254 — ABNORMAL HIGH
Potassium: 3.2 — ABNORMAL LOW
Potassium: 4.1
Sodium: 141
Total Bilirubin: 0.3
Total Protein: 5.3 — ABNORMAL LOW
Total Protein: 5.5 — ABNORMAL LOW

## 2010-12-07 LAB — BASIC METABOLIC PANEL
BUN: 17
BUN: 25 — ABNORMAL HIGH
BUN: 27 — ABNORMAL HIGH
BUN: 10
BUN: 10
BUN: 12
BUN: 12
BUN: 7
BUN: 9
CO2: 22
CO2: 24
CO2: 26
CO2: 27
CO2: 24
CO2: 24
CO2: 26
CO2: 27
CO2: 28
Calcium: 8.6
Calcium: 8.7
Calcium: 8.3 — ABNORMAL LOW
Calcium: 8.4
Calcium: 8.4
Calcium: 8.5
Calcium: 8.6
Chloride: 114 — ABNORMAL HIGH
Chloride: 121 — ABNORMAL HIGH
Chloride: 124 — ABNORMAL HIGH
Chloride: 128 — ABNORMAL HIGH
Chloride: 105
Chloride: 106
Chloride: 108
Chloride: 114 — ABNORMAL HIGH
Chloride: 114 — ABNORMAL HIGH
Creatinine, Ser: 1.21 — ABNORMAL HIGH
Creatinine, Ser: 1.43 — ABNORMAL HIGH
Creatinine, Ser: 1.5 — ABNORMAL HIGH
Creatinine, Ser: 0.84
Creatinine, Ser: 0.86
Creatinine, Ser: 0.86
GFR calc Af Amer: 43 — ABNORMAL LOW
GFR calc Af Amer: 55 — ABNORMAL LOW
GFR calc Af Amer: >60
GFR calc Af Amer: >60
GFR calc Af Amer: >60
GFR calc Af Amer: >60
GFR calc non Af Amer: 35 — ABNORMAL LOW
GFR calc non Af Amer: 53 — ABNORMAL LOW
GFR calc non Af Amer: 54 — ABNORMAL LOW
GFR calc non Af Amer: 58 — ABNORMAL LOW
GFR calc non Af Amer: >60
GFR calc non Af Amer: >60
GFR calc non Af Amer: >60
GFR calc non Af Amer: >60
GFR calc non Af Amer: >60
Glucose, Bld: 112 — ABNORMAL HIGH
Glucose, Bld: 178 — ABNORMAL HIGH
Glucose, Bld: 249 — ABNORMAL HIGH
Glucose, Bld: 321 — ABNORMAL HIGH
Glucose, Bld: 875
Glucose, Bld: 107 — ABNORMAL HIGH
Glucose, Bld: 131 — ABNORMAL HIGH
Glucose, Bld: 143 — ABNORMAL HIGH
Glucose, Bld: 163 — ABNORMAL HIGH
Glucose, Bld: 211 — ABNORMAL HIGH
Glucose, Bld: 326 — ABNORMAL HIGH
Potassium: 3.3 — ABNORMAL LOW
Potassium: 3.4 — ABNORMAL LOW
Potassium: 3.6
Potassium: 6 — ABNORMAL HIGH
Potassium: 2.9 — ABNORMAL LOW
Potassium: 3.1 — ABNORMAL LOW
Potassium: 3.4 — ABNORMAL LOW
Potassium: 3.8
Potassium: 3.8
Potassium: 3.9
Potassium: 4.1
Sodium: 149 — ABNORMAL HIGH
Sodium: 152 — ABNORMAL HIGH
Sodium: 153 — ABNORMAL HIGH
Sodium: 153 — ABNORMAL HIGH
Sodium: 140
Sodium: 141
Sodium: 141
Sodium: 144

## 2010-12-07 LAB — URINALYSIS, ROUTINE W REFLEX MICROSCOPIC
Glucose, UA: >1000 — AB
Protein, ur: NEGATIVE
Urobilinogen, UA: 0.2
pH: 5.5

## 2010-12-07 LAB — CULTURE, BLOOD (ROUTINE X 2): Culture: NO GROWTH

## 2010-12-07 LAB — CARDIAC PANEL(CRET KIN+CKTOT+MB+TROPI)
CK, MB: 5.1 — ABNORMAL HIGH
Total CK: 1674 — ABNORMAL HIGH
Troponin I: 0.02

## 2010-12-07 LAB — MAGNESIUM
Magnesium: 2.3
Magnesium: 2.8 — ABNORMAL HIGH
Magnesium: 1.8
Magnesium: 2.2
Magnesium: 2.2
Magnesium: 2.4

## 2010-12-07 LAB — LACTIC ACID, PLASMA: Lactic Acid, Venous: 4 — ABNORMAL HIGH

## 2010-12-07 LAB — PHOSPHORUS: Phosphorus: 2.9

## 2010-12-07 LAB — URINE MICROSCOPIC-ADD ON

## 2010-12-07 LAB — MYOGLOBIN, SERUM: Myoglobin: 582 — ABNORMAL HIGH

## 2011-04-05 ENCOUNTER — Telehealth: Payer: Self-pay | Admitting: Internal Medicine

## 2011-04-05 NOTE — Telephone Encounter (Signed)
called pt to sch and she has an eye dr appt on 1/28 and may have cataract surg and will c/.b with info to sch around   aom

## 2011-04-08 ENCOUNTER — Telehealth: Payer: Self-pay | Admitting: Internal Medicine

## 2011-04-08 NOTE — Telephone Encounter (Signed)
pt called back to set up new appt,appt made and pt is aware of appt on 1/30 aom

## 2011-04-09 ENCOUNTER — Telehealth: Payer: Self-pay | Admitting: Internal Medicine

## 2011-04-09 NOTE — Telephone Encounter (Signed)
Chart Del. °

## 2011-04-10 ENCOUNTER — Other Ambulatory Visit: Payer: Medicare Other | Admitting: Lab

## 2011-04-10 ENCOUNTER — Ambulatory Visit (HOSPITAL_BASED_OUTPATIENT_CLINIC_OR_DEPARTMENT_OTHER): Payer: Medicare Other | Admitting: Internal Medicine

## 2011-04-10 ENCOUNTER — Ambulatory Visit: Payer: Medicare Other

## 2011-04-10 ENCOUNTER — Other Ambulatory Visit: Payer: Self-pay | Admitting: *Deleted

## 2011-04-10 VITALS — BP 155/88 | HR 95 | Temp 97.6°F | Ht 64.0 in | Wt 264.0 lb

## 2011-04-10 DIAGNOSIS — D329 Benign neoplasm of meninges, unspecified: Secondary | ICD-10-CM

## 2011-04-10 DIAGNOSIS — D429 Neoplasm of uncertain behavior of meninges, unspecified: Secondary | ICD-10-CM | POA: Insufficient documentation

## 2011-04-10 LAB — CBC WITH DIFFERENTIAL/PLATELET
BASO%: 0.1 % (ref 0.0–2.0)
EOS%: 0 % (ref 0.0–7.0)
MCH: 26.8 pg (ref 25.1–34.0)
MCHC: 32.7 g/dL (ref 31.5–36.0)
RBC: 4.77 10*6/uL (ref 3.70–5.45)
RDW: 16.4 % — ABNORMAL HIGH (ref 11.2–14.5)
WBC: 5.3 10*3/uL (ref 3.9–10.3)
lymph#: 1.3 10*3/uL (ref 0.9–3.3)

## 2011-04-10 LAB — COMPREHENSIVE METABOLIC PANEL
ALT: 18 U/L (ref 0–35)
AST: 15 U/L (ref 0–37)
Calcium: 8.9 mg/dL (ref 8.4–10.5)
Chloride: 107 mEq/L (ref 96–112)
Creatinine, Ser: 0.9 mg/dL (ref 0.50–1.10)
Potassium: 3.8 mEq/L (ref 3.5–5.3)
Sodium: 140 mEq/L (ref 135–145)
Total Protein: 6.7 g/dL (ref 6.0–8.3)

## 2011-04-10 NOTE — Progress Notes (Signed)
[facility] CONSULT NOTE  REASON FOR CONSULTATION:  66 years old [place] American female with recurrent unresectable meningioma.  HPI Nancy Bailey is a 66 y.o. female was past medical history significant for hypertension, diabetes mellitus, dyslipidemia, systemic lupus erythematosus, asthma, arthritis and history of recurrent meningioma diagnosed in August 20, 1992. 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 At that time she underwent partial resection of an intraorbital meningioma. In August of 1996 the patient was found to have local recurrence extending through the left sphenoid wing and she underwent a second resection. This was followed by adjuvant radiotherapy from February to April of 1997 for a total of 5400 cGy to the left orbital tumor bed. 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 In March of 2006, the patient was found to have new left posterior falcine meningioma and she was treated with stereotactic radiotherapy on 06/26/2004. She is then had recurrence of the left intraorbital meningioma and the patient received stereotactic radiotherapy to this area on 01/30/2007. Unfortunately the patient continues to have evidence for disease progression at multiple sites including the left orbit, ethmoid sinus, left temporal lobe, left occipital lobe and parafalcine on a recent MRI of the brain performed on 03/15/2011. The patient was seen by Dr. Zachery Conch at [facility]. The patient was not a candidate for any further surgery or radiotherapy. They recommended for her treatment with the Avastin 10 mg/kg every 2 weeks. She preferred to receive her treatment close to home. She was referred to me today for evaluation and starting her treatment in [place], [place]. The patient is feeling fine today with no specific complaints except for the poor vision. She denied having any headache, no significant focal neurological abnormalities. She has no weight loss or night sweats. She has a history of benign adrenal gland lesion. @SFHPI @  PAST MEDICAL HISTORY: significant for hypertension, diabetes mellitus, hypercholesterolemia, recurrent meningioma, arthritis, cataract surgery, systemic lupus erythematosus, adrenal gland adenoma and asthma. She has no history of coronary artery disease or stroke  FAMILY HISTORY: Father died from liver cancer at age 53, mother died at age 49 with Alzheimer.  SOCIAL HISTORY: She is divorced. She has 3 children (2 sons and a daughter who is deceased and 08-21-2007 with encephalopathy) she used to work as a Runner, broadcasting/film/video at [employer], then she worked at the [employer]. She has a history of heavy smoking and quit 3 years ago. She drinks alcohol occasionally but no history of drug abuse.    Allergies  Allergen Reactions  . Codeine Other (See Comments)    hallucinations  . Penicillins Other (See Comments)    headache  . Sulfa Antibiotics Other (See Comments)    headache   Current home medications: Celexa, fesoterodine fumarate, fexofenadine hcl, losartan, metformin,Montelukast, pravastatin, and Venlafaxine.  Review of Systems  A comprehensive review of systems was negative except for: Constitutional: positive for fatigue Ears, nose, mouth, throat, and face: positive for poor vision  Physical Exam  ZOX:WRUEA, healthy, no distress, well nourished and well developed SKIN: skin color, texture, turgor are normal HEAD: Normocephalic, No masses, lesions, tenderness or abnormalities EYES: decreased vision left eye. EARS: External ears normal OROPHARYNX:no exudate, no erythema and lips, buccal mucosa, and tongue normal  NECK: supple, no adenopathy LYMPH:  no palpable lymphadenopathy, no  hepatosplenomegaly BREAST:not examined LUNGS: clear to auscultation  HEART: regular rate & rhythm, no murmurs and no gallops ABDOMEN:abdomen soft, non-tender, normal bowel sounds and no masses or organomegaly BACK: Back symmetric, no curvature. EXTREMITIES:no joint deformities, effusion, or inflammation, no edema, no skin discoloration, no clubbing, no cyanosis  NEURO: alert & oriented x 3 with fluent speech, no focal motor/sensory deficits, gait normal    Studies/Results: No results found.   ASSESSMENT: This is a very pleasant 66 years old African American female diagnosed with unresectable recurrent meningioma. The patient is status post several resection in addition to radiotherapy in the past. She is followed at [facility] and they recommended for her treatment with Avastin on biweekly basis. The patient was referred to me today to deliver her treatment close to home. I have a lengthy discussion with the patient about her condition.  PLAN: #1 I will arrange for the patient had treatment with the Avastin 10 mg/kg every 2 weeks at the [facility] pending authorization from her insurance for this treatment.  #2 I discussed with the patient adverse effect of this treatment including increased risk for bleeding in addition to GI perforation and wound healing delay. The patient would have a chemotherapy education class given early next week.  #3 I expect the patient to start the first cycle of this treatment next week if there is no insurance coverage issues. #4 the patient will come back for followup visit in 3 weeks for evaluation and management any adverse effect of her treatment. I gave the patient the time to ask questions and I answered them completely to her satisfaction.  All questions were answered. The patient knows to call the clinic with any problems, questions or concerns. We can certainly see the patient much sooner if necessary.  Thank you  so much for allowing me to participate in the care of Nancy Bailey. I will continue to follow up the patient with you and assist in her care.  I spent 25 minutes counseling the patient face to face. The total time spent in the appointment was 55 minutes.   Nancy Bailey K. 04/10/2011, 5:23 PM

## 2011-04-11 ENCOUNTER — Other Ambulatory Visit: Payer: Medicare Other

## 2011-04-11 ENCOUNTER — Telehealth: Payer: Self-pay | Admitting: *Deleted

## 2011-04-12 ENCOUNTER — Other Ambulatory Visit (HOSPITAL_BASED_OUTPATIENT_CLINIC_OR_DEPARTMENT_OTHER): Payer: Medicare Other | Admitting: Internal Medicine

## 2011-04-12 ENCOUNTER — Ambulatory Visit: Payer: Medicare Other

## 2011-04-12 DIAGNOSIS — D320 Benign neoplasm of cerebral meninges: Secondary | ICD-10-CM

## 2011-04-12 DIAGNOSIS — D329 Benign neoplasm of meninges, unspecified: Secondary | ICD-10-CM

## 2011-04-15 ENCOUNTER — Telehealth: Payer: Self-pay | Admitting: Internal Medicine

## 2011-04-15 NOTE — Telephone Encounter (Signed)
04/11/11 I was going to have the patient sign the [place] papers when she came to chemo class but she was a no show.  I did call her and told her that we would need her signature to make sure her Dx was covered for Avastin and she said she would come by that afternoon or Friday morning.  Left papers with nurse for Dr. Donnald Garre to sign.  04/12/11 Papers back from Dr. Donnald Garre.  Pt did not come by.  04/15/11 Updated dr Donnald Garre

## 2011-04-17 ENCOUNTER — Other Ambulatory Visit: Payer: Medicare Other | Admitting: Lab

## 2011-04-17 ENCOUNTER — Ambulatory Visit: Payer: Medicare Other

## 2011-04-17 ENCOUNTER — Other Ambulatory Visit: Payer: Medicare Other

## 2011-04-17 ENCOUNTER — Other Ambulatory Visit (HOSPITAL_BASED_OUTPATIENT_CLINIC_OR_DEPARTMENT_OTHER): Payer: Medicare Other | Admitting: Lab

## 2011-04-17 DIAGNOSIS — D429 Neoplasm of uncertain behavior of meninges, unspecified: Secondary | ICD-10-CM

## 2011-04-17 LAB — CBC WITH DIFFERENTIAL/PLATELET
Basophils Absolute: 0 10*3/uL (ref 0.0–0.1)
EOS%: 0.1 % (ref 0.0–7.0)
HGB: 12.6 g/dL (ref 11.6–15.9)
MCH: 26.6 pg (ref 25.1–34.0)
MCV: 81.3 fL (ref 79.5–101.0)
MONO%: 5 % (ref 0.0–14.0)
NEUT%: 61.1 % (ref 38.4–76.8)
RDW: 16.6 % — ABNORMAL HIGH (ref 11.2–14.5)

## 2011-04-17 NOTE — Progress Notes (Unsigned)
0930-Pt to attend chemotherapy class at 10:00AM today.  No Avastin today per Dr. Shirline Frees.  Pt notified and escorted to chemo class.  Pt with no questions at this time.

## 2011-04-18 NOTE — Telephone Encounter (Signed)
04/17/11 Patient came by and signed the [place] papers and I faxed them in.  Per Rolla Plate has met her 350.00 deductible and will have a 15% coinsurance until she meets her 8000.00 OOP Max.  She has met 498.00.  It does require a pre-cert from [place] and I will fax that in.  I did call the patient and told her where we were in the process.

## 2011-04-24 NOTE — Telephone Encounter (Signed)
No note

## 2011-05-01 ENCOUNTER — Ambulatory Visit (HOSPITAL_BASED_OUTPATIENT_CLINIC_OR_DEPARTMENT_OTHER): Payer: Medicare Other | Admitting: Internal Medicine

## 2011-05-01 ENCOUNTER — Other Ambulatory Visit: Payer: Medicare Other | Admitting: Lab

## 2011-05-01 ENCOUNTER — Ambulatory Visit: Payer: Medicare Other

## 2011-05-01 VITALS — BP 159/95 | HR 91 | Temp 99.4°F | Ht 64.0 in | Wt 264.2 lb

## 2011-05-01 DIAGNOSIS — D429 Neoplasm of uncertain behavior of meninges, unspecified: Secondary | ICD-10-CM

## 2011-05-01 NOTE — Progress Notes (Signed)
[facility] [phone]   [phone]  OFFICE PROGRESS NOTE   DIAGNOSIS: Recurrent unresectable meningioma.  PRIOR THERAPY: Status post several resection and stereotactic radiotherapy.  CURRENT THERAPY: None.  INTERVAL HISTORY: Nancy Bailey 66 y.o. Bailey returns to the clinic today for followup visit. The patient was seen for initial evaluation on 04/10/2011. She was referred to me from [facility] for her treatment with Avastin every 2 weeks. Our office has been working with her insurance company regarding coverage for this treatment, but it was denied till now. The patient is here today for evaluation and recommendation regarding her treatment. She is feeling fine and he denied having any complaints. She is not interested in proceeding with the treatment with Avastin unless it is completely covered by her insurance.  ALLERGIES:  is allergic to codeine; penicillins; and sulfa antibiotics.  MEDICATIONS:  Current Outpatient Prescriptions  Medication Sig Dispense Refill  . citalopram (CELEXA) 20 MG tablet Take 20 mg by mouth daily.      . fexofenadine (ALLEGRA) 180 MG tablet Take 180 mg by mouth daily.      . halobetasol (ULTRAVATE) 0.05 % cream Apply topically 2 (two) times daily as needed.      Marland Kitchen losartan (COZAAR) 100 MG tablet Take 100 mg by mouth daily.      . metFORMIN (GLUCOPHAGE) 500 MG tablet Take 500 mg by mouth daily with breakfast.      . montelukast (SINGULAIR) 10 MG tablet Take 10 mg by mouth at bedtime.      . pravastatin (PRAVACHOL) 20 MG tablet Take 20 mg by mouth daily.      Marland Kitchen UNABLE TO FIND as needed. Prednisone Acetate Opthalamic Solution      . venlafaxine (EFFEXOR) 75 MG tablet Take 75 mg by mouth 2 (two) times daily.       REVIEW OF SYSTEMS:  A comprehensive review of systems was negative.   PHYSICAL EXAMINATION: General appearance: alert, cooperative and no distress Head: Normocephalic, without obvious abnormality,  atraumatic Lymph nodes: Cervical, supraclavicular, and axillary nodes normal. Resp: clear to auscultation bilaterally Cardio: regular rate and rhythm, S1, S2 normal, no murmur, click, rub or gallop GI: soft, non-tender; bowel sounds normal; no masses,  no organomegaly Extremities: extremities normal, atraumatic, no cyanosis or edema Neurologic: Alert and oriented X 3, normal strength and tone. Normal symmetric reflexes. Normal coordination and gait  ECOG PERFORMANCE STATUS: 0 - Asymptomatic  Blood pressure 159/95, pulse 91, temperature 99.4 F (37.4 C), temperature source Oral, height 5\' 4"  (1.626 m), weight 264 lb 3.2 oz (119.84 kg).  LABORATORY DATA: Lab Results  Component Value Date   WBC 6.1 04/17/2011   HGB 12.6 04/17/2011   HCT 38.4 04/17/2011   MCV 81.3 04/17/2011   PLT 284 04/17/2011      Chemistry      Component Value Date/Time   NA 140 04/10/2011 1340   K 3.8 04/10/2011 1340   CL 107 04/10/2011 1340   CO2 22 04/10/2011 1340   BUN 12 04/10/2011 1340   CREATININE 0.90 04/10/2011 1340      Component Value Date/Time   CALCIUM 8.9 04/10/2011 1340   ALKPHOS 104 04/10/2011 1340   AST 15 04/10/2011 1340   ALT 18 04/10/2011 1340   BILITOT 0.3 04/10/2011 1340       RADIOGRAPHIC STUDIES: No results found.  ASSESSMENT: This is a very pleasant 66 years old Nancy Bailey with recurrent unresectable meningioma. I  have a lengthy discussion with the patient today about her condition and treatment option. Unfortunately, her insurance company denied coverage for the treatment with Avastin for her condition and the patient does not like to proceed with the treatment unless she has full coverage.  PLAN: I recommended for her to go back to a [facility] for reevaluation and consideration of other treatment options including surgical resection or to receive Avastin there on clinical trial basis. I would see the patient and his children as needed basis as I don't have  any treatment options for her at this point. The patient agreed to the current plan.  All questions were answered. The patient knows to call the clinic with any problems, questions or concerns. We can certainly see the patient much sooner if necessary.

## 2011-05-08 ENCOUNTER — Other Ambulatory Visit: Payer: Medicare Other | Admitting: Lab

## 2011-05-08 ENCOUNTER — Ambulatory Visit: Payer: Medicare Other | Admitting: Internal Medicine

## 2011-05-29 ENCOUNTER — Other Ambulatory Visit: Payer: Medicare Other | Admitting: Lab

## 2011-05-31 ENCOUNTER — Encounter: Payer: Self-pay | Admitting: *Deleted

## 2011-05-31 NOTE — Progress Notes (Signed)
Pt called stating her MD at [facility] wants her to start a new medication and she will need weekly labs.  Her MD has given her a rx for labwork to be done here at [facility] and she wants to give [place] the rx for the labwork.  She stated "she cannot keep the rx here because she is worried her grandbabies will throw it away or destroy it".  Informed her Dr Donnald Garre is not in the office today but we will need to make sure he is okay with her having lab appts done here.  Will give msg to desk RN to discuss with Dr Donnald Garre on Monday.  Pt stated she will drop off rx for labwork off at the front desk on Monday.  SLJ

## 2011-06-19 ENCOUNTER — Other Ambulatory Visit: Payer: Medicare Other | Admitting: Lab

## 2011-06-20 ENCOUNTER — Telehealth: Payer: Self-pay | Admitting: Medical Oncology

## 2011-06-20 NOTE — Telephone Encounter (Signed)
Asking about new drug that [facility] recommended- I told her we have not received a note from [facility] from her visit in march . She is going to call [facility] and ask them to fax visit note.

## 2011-11-14 ENCOUNTER — Other Ambulatory Visit: Payer: Self-pay | Admitting: *Deleted

## 2011-11-14 ENCOUNTER — Telehealth: Payer: Self-pay | Admitting: Internal Medicine

## 2011-11-14 NOTE — Telephone Encounter (Signed)
Talked to patient and she is aware of appt for labs starting 9/6, pt advised to get calendar appt tomorrow

## 2011-11-14 NOTE — Progress Notes (Signed)
Received call from [place] at [facility] [phone] requesting pt to have weekly labs x 6 months.  Rx faxed over from [facility] with lab orders.  Onc tx schedule filled out.  Order for labwork from [place] given to [place] in the lab.  SLJ

## 2011-11-15 ENCOUNTER — Other Ambulatory Visit: Payer: Medicare Other | Admitting: Lab

## 2011-11-15 ENCOUNTER — Telehealth: Payer: Self-pay | Admitting: *Deleted

## 2011-11-15 NOTE — Telephone Encounter (Signed)
Walk in form received from Ms. Nancy Bailey from patient requesting to see MD for "side effects of medications of diarrhea, nausea and headaches".  Patient here for lab draw "close to home to be sent to [facility]" as requested by [facility] on 11-14-2011 per collaborative nurse.  Ms. Angulo is followed and receiving avastin treatments at [facility].  Says she also takes pills.  Gleevec and Temodar noted in a file from [facility] in June 2013.  Instructed pt. to go to ER.  Says she'll call [facility] on Monday.  Encouraged to go to ER if in distress and again says no.

## 2011-11-22 ENCOUNTER — Other Ambulatory Visit: Payer: Medicare Other | Admitting: Lab

## 2011-11-29 ENCOUNTER — Other Ambulatory Visit (HOSPITAL_BASED_OUTPATIENT_CLINIC_OR_DEPARTMENT_OTHER): Payer: Medicare Other | Admitting: Lab

## 2011-11-29 DIAGNOSIS — D429 Neoplasm of uncertain behavior of meninges, unspecified: Secondary | ICD-10-CM

## 2011-12-06 ENCOUNTER — Other Ambulatory Visit: Payer: Medicare Other | Admitting: Lab

## 2011-12-13 ENCOUNTER — Other Ambulatory Visit: Payer: Medicare Other | Admitting: Lab

## 2011-12-19 ENCOUNTER — Telehealth: Payer: Self-pay | Admitting: *Deleted

## 2011-12-19 NOTE — Telephone Encounter (Signed)
Pt called wanting to know whether she "can begin taking her medications again".  Informed her that Dr Donnald Garre is not overseeing her care at this time and she needs to contact her MD at [facility].  She verbalized understanding.  SLJ

## 2011-12-20 ENCOUNTER — Other Ambulatory Visit: Payer: Medicare Other | Admitting: Lab

## 2011-12-27 ENCOUNTER — Other Ambulatory Visit: Payer: Medicare Other | Admitting: Lab

## 2012-01-03 ENCOUNTER — Telehealth: Payer: Self-pay

## 2012-01-03 ENCOUNTER — Other Ambulatory Visit: Payer: Medicare Other | Admitting: Lab

## 2012-01-03 NOTE — Telephone Encounter (Signed)
Patient unable to come for labs today.states she will come in the morning.

## 2012-01-08 ENCOUNTER — Telehealth: Payer: Self-pay | Admitting: Internal Medicine

## 2012-01-08 NOTE — Telephone Encounter (Signed)
pt called in to change lab appt to 11/4

## 2012-01-10 ENCOUNTER — Other Ambulatory Visit: Payer: Medicare Other | Admitting: Lab

## 2012-01-13 ENCOUNTER — Other Ambulatory Visit: Payer: Medicare Other | Admitting: Lab

## 2012-01-17 ENCOUNTER — Other Ambulatory Visit: Payer: Medicare Other | Admitting: Lab

## 2012-01-24 ENCOUNTER — Other Ambulatory Visit: Payer: Medicare Other | Admitting: Lab

## 2012-01-31 ENCOUNTER — Other Ambulatory Visit: Payer: PRIVATE HEALTH INSURANCE | Admitting: Lab

## 2012-02-07 ENCOUNTER — Other Ambulatory Visit: Payer: PRIVATE HEALTH INSURANCE | Admitting: Lab

## 2012-02-14 ENCOUNTER — Other Ambulatory Visit: Payer: PRIVATE HEALTH INSURANCE | Admitting: Lab

## 2012-02-21 ENCOUNTER — Other Ambulatory Visit: Payer: PRIVATE HEALTH INSURANCE | Admitting: Lab

## 2012-02-28 ENCOUNTER — Other Ambulatory Visit: Payer: PRIVATE HEALTH INSURANCE

## 2012-03-06 ENCOUNTER — Other Ambulatory Visit (HOSPITAL_BASED_OUTPATIENT_CLINIC_OR_DEPARTMENT_OTHER): Payer: PRIVATE HEALTH INSURANCE | Admitting: Lab

## 2012-03-06 DIAGNOSIS — D649 Anemia, unspecified: Secondary | ICD-10-CM

## 2012-03-13 ENCOUNTER — Other Ambulatory Visit: Payer: Medicare Other | Admitting: Lab

## 2012-03-20 ENCOUNTER — Other Ambulatory Visit (HOSPITAL_BASED_OUTPATIENT_CLINIC_OR_DEPARTMENT_OTHER): Payer: PRIVATE HEALTH INSURANCE | Admitting: Lab

## 2012-03-20 DIAGNOSIS — D649 Anemia, unspecified: Secondary | ICD-10-CM

## 2012-03-27 ENCOUNTER — Other Ambulatory Visit: Payer: PRIVATE HEALTH INSURANCE | Admitting: Lab

## 2012-04-03 ENCOUNTER — Other Ambulatory Visit (HOSPITAL_BASED_OUTPATIENT_CLINIC_OR_DEPARTMENT_OTHER): Payer: PRIVATE HEALTH INSURANCE | Admitting: Lab

## 2012-04-03 DIAGNOSIS — D649 Anemia, unspecified: Secondary | ICD-10-CM

## 2012-04-10 ENCOUNTER — Other Ambulatory Visit: Payer: PRIVATE HEALTH INSURANCE | Admitting: Lab

## 2012-04-17 ENCOUNTER — Other Ambulatory Visit: Payer: Medicare Other | Admitting: Lab

## 2012-04-24 ENCOUNTER — Other Ambulatory Visit: Payer: Medicare Other | Admitting: Lab

## 2012-05-01 ENCOUNTER — Telehealth: Payer: Self-pay | Admitting: Internal Medicine

## 2012-05-01 ENCOUNTER — Other Ambulatory Visit: Payer: Self-pay | Admitting: Medical Oncology

## 2012-05-01 NOTE — Telephone Encounter (Signed)
per Dr. Arbutus Ped Dr. Deschard needs to contact him so that he can order some more labs

## 2012-07-21 ENCOUNTER — Encounter: Payer: Self-pay | Admitting: Internal Medicine

## 2012-08-05 ENCOUNTER — Encounter: Payer: PRIVATE HEALTH INSURANCE | Admitting: Internal Medicine

## 2012-08-05 ENCOUNTER — Other Ambulatory Visit: Payer: Self-pay | Admitting: Internal Medicine

## 2012-08-05 DIAGNOSIS — R197 Diarrhea, unspecified: Secondary | ICD-10-CM

## 2012-08-13 ENCOUNTER — Ambulatory Visit
Admission: RE | Admit: 2012-08-13 | Discharge: 2012-08-13 | Disposition: A | Discharge status: Home / Self Care | Payer: PRIVATE HEALTH INSURANCE | Source: Ambulatory Visit | Attending: Internal Medicine | Admitting: Internal Medicine

## 2012-08-13 DIAGNOSIS — R197 Diarrhea, unspecified: Secondary | ICD-10-CM

## 2012-08-13 MED ORDER — IOHEXOL 300 MG/ML  SOLN
125.0000 mL | Freq: Once | INTRAMUSCULAR | Status: AC | PRN
  Administered 2012-08-13: 125 mL via INTRAVENOUS

## 2012-08-14 ENCOUNTER — Encounter: Payer: Self-pay | Admitting: Internal Medicine

## 2012-08-19 ENCOUNTER — Ambulatory Visit: Payer: PRIVATE HEALTH INSURANCE | Admitting: Internal Medicine

## 2012-09-15 ENCOUNTER — Encounter: Payer: Self-pay | Admitting: Internal Medicine

## 2012-09-23 ENCOUNTER — Ambulatory Visit: Payer: PRIVATE HEALTH INSURANCE | Admitting: Internal Medicine

## 2014-11-10 ENCOUNTER — Inpatient Hospital Stay (HOSPITAL_COMMUNITY)
Admission: EM | Admit: 2014-11-10 | Discharge: 2014-11-14 | DRG: 100 | Disposition: A | Discharge status: Skilled Nursing Facility | Payer: Medicare Other | Attending: Internal Medicine | Admitting: Internal Medicine

## 2014-11-10 ENCOUNTER — Encounter (HOSPITAL_COMMUNITY): Payer: Self-pay | Admitting: Emergency Medicine

## 2014-11-10 ENCOUNTER — Emergency Department (HOSPITAL_COMMUNITY): Payer: Medicare Other

## 2014-11-10 DIAGNOSIS — G40909 Epilepsy, unspecified, not intractable, without status epilepticus: Principal | ICD-10-CM | POA: Diagnosis present

## 2014-11-10 DIAGNOSIS — R32 Unspecified urinary incontinence: Secondary | ICD-10-CM | POA: Diagnosis present

## 2014-11-10 DIAGNOSIS — Z79899 Other long term (current) drug therapy: Secondary | ICD-10-CM

## 2014-11-10 DIAGNOSIS — R4702 Dysphasia: Secondary | ICD-10-CM | POA: Diagnosis present

## 2014-11-10 DIAGNOSIS — I10 Essential (primary) hypertension: Secondary | ICD-10-CM | POA: Diagnosis present

## 2014-11-10 DIAGNOSIS — R9431 Abnormal electrocardiogram [ECG] [EKG]: Secondary | ICD-10-CM | POA: Diagnosis present

## 2014-11-10 DIAGNOSIS — Y92009 Unspecified place in unspecified non-institutional (private) residence as the place of occurrence of the external cause: Secondary | ICD-10-CM

## 2014-11-10 DIAGNOSIS — F17210 Nicotine dependence, cigarettes, uncomplicated: Secondary | ICD-10-CM | POA: Diagnosis present

## 2014-11-10 DIAGNOSIS — E162 Hypoglycemia, unspecified: Secondary | ICD-10-CM | POA: Diagnosis present

## 2014-11-10 DIAGNOSIS — M6282 Rhabdomyolysis: Secondary | ICD-10-CM | POA: Diagnosis present

## 2014-11-10 DIAGNOSIS — D320 Benign neoplasm of cerebral meninges: Secondary | ICD-10-CM | POA: Diagnosis present

## 2014-11-10 DIAGNOSIS — D429 Neoplasm of uncertain behavior of meninges, unspecified: Secondary | ICD-10-CM | POA: Diagnosis present

## 2014-11-10 DIAGNOSIS — W19XXXA Unspecified fall, initial encounter: Secondary | ICD-10-CM

## 2014-11-10 DIAGNOSIS — W182XXA Fall in (into) shower or empty bathtub, initial encounter: Secondary | ICD-10-CM | POA: Diagnosis present

## 2014-11-10 DIAGNOSIS — E119 Type 2 diabetes mellitus without complications: Secondary | ICD-10-CM

## 2014-11-10 DIAGNOSIS — F0280 Dementia in other diseases classified elsewhere without behavioral disturbance: Secondary | ICD-10-CM | POA: Diagnosis present

## 2014-11-10 DIAGNOSIS — R4182 Altered mental status, unspecified: Secondary | ICD-10-CM | POA: Diagnosis present

## 2014-11-10 DIAGNOSIS — E878 Other disorders of electrolyte and fluid balance, not elsewhere classified: Secondary | ICD-10-CM | POA: Diagnosis present

## 2014-11-10 DIAGNOSIS — R531 Weakness: Secondary | ICD-10-CM | POA: Diagnosis not present

## 2014-11-10 DIAGNOSIS — G309 Alzheimer's disease, unspecified: Secondary | ICD-10-CM | POA: Diagnosis present

## 2014-11-10 DIAGNOSIS — E11649 Type 2 diabetes mellitus with hypoglycemia without coma: Secondary | ICD-10-CM | POA: Diagnosis present

## 2014-11-10 DIAGNOSIS — G9340 Encephalopathy, unspecified: Secondary | ICD-10-CM | POA: Diagnosis present

## 2014-11-10 DIAGNOSIS — E876 Hypokalemia: Secondary | ICD-10-CM | POA: Diagnosis present

## 2014-11-10 HISTORY — DX: Alzheimer's disease, unspecified: G30.9

## 2014-11-10 HISTORY — DX: Malignant (primary) neoplasm, unspecified: C80.1

## 2014-11-10 HISTORY — DX: Nicotine dependence, unspecified, uncomplicated: F17.200

## 2014-11-10 HISTORY — DX: Dementia in other diseases classified elsewhere, unspecified severity, without behavioral disturbance, psychotic disturbance, mood disturbance, and anxiety: F02.80

## 2014-11-10 HISTORY — DX: Type 2 diabetes mellitus without complications: E11.9

## 2014-11-10 LAB — BASIC METABOLIC PANEL
Anion gap: 9 (ref 5–15)
BUN: 9 mg/dL (ref 6–20)
CO2: 27 mmol/L (ref 22–32)
CREATININE: 0.84 mg/dL (ref 0.44–1.00)
Calcium: 10 mg/dL (ref 8.9–10.3)
Chloride: 105 mmol/L (ref 101–111)
GFR calc Af Amer: >60 mL/min (ref >60)
GLUCOSE: 102 mg/dL — AB (ref 65–99)
POTASSIUM: 3.3 mmol/L — AB (ref 3.5–5.1)
SODIUM: 141 mmol/L (ref 135–145)

## 2014-11-10 LAB — CBC WITH DIFFERENTIAL/PLATELET
Basophils Absolute: 0 10*3/uL (ref 0.0–0.1)
Basophils Relative: 0 % (ref 0–1)
EOS ABS: 0 10*3/uL (ref 0.0–0.7)
EOS PCT: 0 % (ref 0–5)
HCT: 48.2 % — ABNORMAL HIGH (ref 36.0–46.0)
Hemoglobin: 15.8 g/dL — ABNORMAL HIGH (ref 12.0–15.0)
LYMPHS ABS: 1.5 10*3/uL (ref 0.7–4.0)
LYMPHS PCT: 17 % (ref 12–46)
MCH: 28.2 pg (ref 26.0–34.0)
MCHC: 32.8 g/dL (ref 30.0–36.0)
MCV: 86.1 fL (ref 78.0–100.0)
MONO ABS: 0.7 10*3/uL (ref 0.1–1.0)
MONOS PCT: 8 % (ref 3–12)
Neutro Abs: 6.7 10*3/uL (ref 1.7–7.7)
Neutrophils Relative %: 75 % (ref 43–77)
PLATELETS: 222 10*3/uL (ref 150–400)
RBC: 5.6 MIL/uL — AB (ref 3.87–5.11)
RDW: 14.3 % (ref 11.5–15.5)
WBC: 8.9 10*3/uL (ref 4.0–10.5)

## 2014-11-10 LAB — URINALYSIS, ROUTINE W REFLEX MICROSCOPIC
Bilirubin Urine: NEGATIVE
GLUCOSE, UA: NEGATIVE mg/dL
KETONES UR: 15 mg/dL — AB
LEUKOCYTES UA: NEGATIVE
Nitrite: NEGATIVE
PH: 6.5 (ref 5.0–8.0)
Protein, ur: 100 mg/dL — AB
Specific Gravity, Urine: 1.016 (ref 1.005–1.030)
Urobilinogen, UA: 1 mg/dL (ref 0.0–1.0)

## 2014-11-10 LAB — URINE MICROSCOPIC-ADD ON

## 2014-11-10 LAB — I-STAT TROPONIN, ED: Troponin i, poc: 0 ng/mL (ref 0.00–0.08)

## 2014-11-10 LAB — CBG MONITORING, ED: GLUCOSE-CAPILLARY: 99 mg/dL (ref 65–99)

## 2014-11-10 LAB — CK: CK TOTAL: 496 U/L — AB (ref 38–234)

## 2014-11-10 NOTE — ED Notes (Signed)
Bed: KD32 Expected date:  Expected time:  Means of arrival:  Comments: EMS/57F/fall

## 2014-11-10 NOTE — Progress Notes (Signed)
[place] orders for RN, PT, OT, aide and SW with [facility] to [facility] if patient is discharged.  Thank you.

## 2014-11-10 NOTE — ED Provider Notes (Signed)
CSN: [id number]     Arrival date & time 11/10/14  2052 History   First MD Initiated Contact with Patient 11/10/14 2114     Chief Complaint  Patient presents with  . Fall     (Consider location/radiation/quality/duration/timing/severity/associated sxs/prior Treatment) HPI Comments: Patient with a history of Meningioma treated at [facility] presents today after a fall.  Family report that the patient's son found the patient on the ground just prior to arrival.  Fall was unwitnessed.  Son is unsure how long the patient was on the ground.  He had been gone for several hours.  Patient was conscious when she was found.  Patient not communicating at this time.  Patient unable to state if she is in pain.  Unable to give any details of the fall.  Friend that is in the room with her states that the patient has had some intermittent confusion for the past 1.5 months. However, son reports that she does communicate at baseline.  Both friend and son report that she was communicating two days ago.  She currently lives with her son, but is home by herself during the day when the son is at work.  She had a MRI brain done recently on 10/20/14, which showed slight interval enlargement of masses within the left ethmoid region, right frontal lobe, and left temporal lobe.  She is currently not on chemotherapy.  Last chemo was October of 2015.  She no longer receives radiation.  Last radiation was 2013.  Family report that they have not noticed any vomiting.  No fevers or chills.  No cough. Level V Caveat applies due to the fact that patient is non verbal.    The history is provided by the patient.    Past Medical History  Diagnosis Date  . Cancer     Brain?  . Alzheimer disease   . Diabetes mellitus without complication    History reviewed. No pertinent past surgical history. No family history on file. Social History  Substance Use Topics  . Smoking status: Unknown If Ever Smoked  . Smokeless tobacco: None   . Alcohol Use: No   OB History    No data available     Review of Systems  Unable to perform ROS: Patient nonverbal      Allergies  Codeine; Penicillins; and Sulfa antibiotics  Home Medications   Prior to Admission medications   Medication Sig Start Date End Date Taking? Authorizing Provider  amLODipine (NORVASC) 10 MG tablet Take 10 mg by mouth daily.   Yes Historical Provider, MD  levETIRAcetam (KEPPRA) 750 MG tablet Take 750 mg by mouth every 12 (twelve) hours.   Yes Historical Provider, MD  losartan (COZAAR) 100 MG tablet Take 100 mg by mouth daily.   Yes Historical Provider, MD  tamsulosin (FLOMAX) 0.4 MG CAPS capsule Take 0.4 mg by mouth daily.   Yes Historical Provider, MD  venlafaxine XR (EFFEXOR-XR) 150 MG 24 hr capsule Take 150 mg by mouth at bedtime.   Yes Historical Provider, MD   BP 152/95 mmHg  Pulse 82  Temp(Src) 98.6 F (37 C) (Oral)  Resp 20  SpO2 100% Physical Exam  Constitutional: She appears well-developed and well-nourished.  HENT:  Head: Normocephalic and atraumatic.  Left eye exophthalmus  Neck: Normal range of motion. Neck supple.  Neck in c-collar  Cardiovascular: Normal rate, regular rhythm and normal heart sounds.   Pulmonary/Chest: Effort normal and breath sounds normal.  Abdominal: Soft. Bowel sounds are normal. She  exhibits no distension.  Musculoskeletal: Normal range of motion.  Full ROM of all extremities without apparent pain  Neurological: She is alert. She exhibits normal muscle tone.  Patient not orientated to person, place, or time Did not assess gait.  Unable to assess cranial nerves.  Patient not responding to commands.    Skin: Skin is warm and dry.  Psychiatric: She has a normal mood and affect.  Nursing note and vitals reviewed.   ED Course  Procedures (including critical care time) Labs Review Labs Reviewed - No data to display  Imaging Review No results found. I have personally reviewed and evaluated these  images and lab results as part of my medical decision-making.   EKG Interpretation   Date/Time:  Thursday November 10 2014 21:25:12 EDT Ventricular Rate:  83 PR Interval:  156 QRS Duration: 84 QT Interval:  483 QTC Calculation: 568 R Axis:   0 Text Interpretation:  Sinus rhythm Anterior infarct, old Borderline  repolarization abnormality Prolonged QT interval Confirmed by ZAMMIT  MD,  JOSEPH [phone]) on 11/10/2014 9:42:40 PM      MDM   Final diagnoses:  None   Patient with a history of unresectable meningioma presents today after an unwitnessed fall.  Patient unable to provide further history.  Initially patient completely non verbal.  However, during reassessment she would say yes or no.  No obvious signs of trauma on exam.  CT head and cervical spine are negative for acute findings.  CT scan showing progression of disease.  However, when radiology compared with recent MRI at [facility] it seems that there is no significant changes.  Labs unremarkable.  UA is negative.  Patient unable to ambulate in the ED.  No apparent pain with ROM of lower extremities.  Family report that patient has had increased confusion recently and that she is able to communicate at baseline.  Patient admitted to [facility].  Patient also discussed with Dr. Roderic Palau.    Hyman Bible, PA-C 11/12/14 [place], PA-C 11/12/14 1953  Milton Ferguson, MD 11/12/14 2329

## 2014-11-10 NOTE — ED Notes (Signed)
Bed: WA67 Expected date:  Expected time:  Means of arrival:  Comments: Ems/69y.o. syncope/n/v

## 2014-11-10 NOTE — Progress Notes (Signed)
CSW attempted to speak with patient at bedside. However, she was not communicative. There was no family present.  CSW made nurse aware.   [name] [phone] ED CSW 11/10/2014 11:06 PM

## 2014-11-10 NOTE — ED Notes (Addendum)
Pt presents from home via EMS. Pt fell in shower, increased falls x2 weeks. Increased difficulty ambulating. No obvious pain, deformity. Family reports swelling over left eye is baseline.   Last VS cbg126, 147/85, 87hr, 16resp, 99%ra.

## 2014-11-10 NOTE — Progress Notes (Signed)
EDCM spoke to patient's son and patient's friend Barbaraann Share at bedside.  Patient presents to Ed with fall at home.  Patient lives at home with her son.  Patient is left alone at home for at least eight hours a day while her son is at work.  Patient has a cane at home but she doesn't use  It per patient's son.  Patient's son reports the patient is usually able to complete her ADL's without difficulty and can ambulate on her own.  Patient's friend reports patient's pcp is Dr. Jani Gravel.  System updated.  Patient does not have home health services at this time.  Patient's son interested for patient to have a wheelchair and a walker.  Orders placed.  EDCM provided patient's son with list of home health agencies in [facility], explained services.  Explained that home health agency of their choice has 24-48 hours to contact them.  EDCM also provided patient's son with list of private duty nursing agencies and explained it would be an out of pocket expense.  EDCM discussed with EDPA regarding home health orders for RN, PT, OT aide and social worker to be placed by EDP if patient is discharged.  EDCM will alo place [facility] consult.  Patient's son and patient's friend thankful for services.  Patient's son reports he will be calling his brother to see if he can stay with the patient during the day.  Patient's son provided phone number [phone] for follow up.  No further EDCM needs at this time.

## 2014-11-11 ENCOUNTER — Observation Stay (HOSPITAL_COMMUNITY): Payer: Medicare Other

## 2014-11-11 ENCOUNTER — Encounter (HOSPITAL_COMMUNITY): Payer: Self-pay | Admitting: *Deleted

## 2014-11-11 ENCOUNTER — Observation Stay (HOSPITAL_BASED_OUTPATIENT_CLINIC_OR_DEPARTMENT_OTHER)
Admit: 2014-11-11 | Discharge: 2014-11-11 | Disposition: A | Discharge status: Home / Self Care | Payer: Medicare Other | Attending: Internal Medicine | Admitting: Internal Medicine

## 2014-11-11 DIAGNOSIS — Z79899 Other long term (current) drug therapy: Secondary | ICD-10-CM | POA: Diagnosis not present

## 2014-11-11 DIAGNOSIS — R32 Unspecified urinary incontinence: Secondary | ICD-10-CM | POA: Diagnosis present

## 2014-11-11 DIAGNOSIS — G309 Alzheimer's disease, unspecified: Secondary | ICD-10-CM | POA: Diagnosis present

## 2014-11-11 DIAGNOSIS — E119 Type 2 diabetes mellitus without complications: Secondary | ICD-10-CM

## 2014-11-11 DIAGNOSIS — W19XXXA Unspecified fall, initial encounter: Secondary | ICD-10-CM

## 2014-11-11 DIAGNOSIS — E876 Hypokalemia: Secondary | ICD-10-CM | POA: Diagnosis present

## 2014-11-11 DIAGNOSIS — R4182 Altered mental status, unspecified: Secondary | ICD-10-CM | POA: Diagnosis not present

## 2014-11-11 DIAGNOSIS — F17210 Nicotine dependence, cigarettes, uncomplicated: Secondary | ICD-10-CM | POA: Diagnosis present

## 2014-11-11 DIAGNOSIS — G40909 Epilepsy, unspecified, not intractable, without status epilepticus: Secondary | ICD-10-CM

## 2014-11-11 DIAGNOSIS — E118 Type 2 diabetes mellitus with unspecified complications: Secondary | ICD-10-CM

## 2014-11-11 DIAGNOSIS — I10 Essential (primary) hypertension: Secondary | ICD-10-CM | POA: Diagnosis present

## 2014-11-11 DIAGNOSIS — R400 Somnolence: Secondary | ICD-10-CM | POA: Diagnosis not present

## 2014-11-11 DIAGNOSIS — D320 Benign neoplasm of cerebral meninges: Secondary | ICD-10-CM | POA: Diagnosis present

## 2014-11-11 DIAGNOSIS — G9340 Encephalopathy, unspecified: Secondary | ICD-10-CM | POA: Diagnosis not present

## 2014-11-11 DIAGNOSIS — R531 Weakness: Secondary | ICD-10-CM | POA: Diagnosis present

## 2014-11-11 DIAGNOSIS — W182XXA Fall in (into) shower or empty bathtub, initial encounter: Secondary | ICD-10-CM | POA: Diagnosis present

## 2014-11-11 DIAGNOSIS — E11649 Type 2 diabetes mellitus with hypoglycemia without coma: Secondary | ICD-10-CM | POA: Diagnosis not present

## 2014-11-11 DIAGNOSIS — M6282 Rhabdomyolysis: Secondary | ICD-10-CM | POA: Diagnosis present

## 2014-11-11 DIAGNOSIS — Y92009 Unspecified place in unspecified non-institutional (private) residence as the place of occurrence of the external cause: Secondary | ICD-10-CM | POA: Diagnosis not present

## 2014-11-11 DIAGNOSIS — R4702 Dysphasia: Secondary | ICD-10-CM | POA: Diagnosis not present

## 2014-11-11 DIAGNOSIS — F0280 Dementia in other diseases classified elsewhere without behavioral disturbance: Secondary | ICD-10-CM | POA: Diagnosis present

## 2014-11-11 DIAGNOSIS — E878 Other disorders of electrolyte and fluid balance, not elsewhere classified: Secondary | ICD-10-CM | POA: Diagnosis present

## 2014-11-11 DIAGNOSIS — W19XXXD Unspecified fall, subsequent encounter: Secondary | ICD-10-CM | POA: Diagnosis not present

## 2014-11-11 LAB — COMPREHENSIVE METABOLIC PANEL
ALBUMIN: 3.8 g/dL (ref 3.5–5.0)
ALT: 20 U/L (ref 14–54)
ANION GAP: 12 (ref 5–15)
AST: 31 U/L (ref 15–41)
Alkaline Phosphatase: 122 U/L (ref 38–126)
BILIRUBIN TOTAL: 0.8 mg/dL (ref 0.3–1.2)
BUN: 10 mg/dL (ref 6–20)
CHLORIDE: 104 mmol/L (ref 101–111)
CO2: 21 mmol/L — AB (ref 22–32)
Calcium: 9.5 mg/dL (ref 8.9–10.3)
Creatinine, Ser: 0.86 mg/dL (ref 0.44–1.00)
GFR calc Af Amer: >60 mL/min (ref >60)
GFR calc non Af Amer: >60 mL/min (ref >60)
GLUCOSE: 115 mg/dL — AB (ref 65–99)
POTASSIUM: 5.5 mmol/L — AB (ref 3.5–5.1)
SODIUM: 137 mmol/L (ref 135–145)
Total Protein: 7.1 g/dL (ref 6.5–8.1)

## 2014-11-11 LAB — GLUCOSE, CAPILLARY
GLUCOSE-CAPILLARY: 105 mg/dL — AB (ref 65–99)
Glucose-Capillary: 110 mg/dL — ABNORMAL HIGH (ref 65–99)
Glucose-Capillary: 88 mg/dL (ref 65–99)
Glucose-Capillary: 94 mg/dL (ref 65–99)

## 2014-11-11 LAB — CBC WITH DIFFERENTIAL/PLATELET
BASOS ABS: 0 10*3/uL (ref 0.0–0.1)
BASOS PCT: 0 % (ref 0–1)
EOS ABS: 0 10*3/uL (ref 0.0–0.7)
Eosinophils Relative: 0 % (ref 0–5)
HEMATOCRIT: 45.9 % (ref 36.0–46.0)
Hemoglobin: 14.5 g/dL (ref 12.0–15.0)
Lymphocytes Relative: 22 % (ref 12–46)
Lymphs Abs: 1.7 10*3/uL (ref 0.7–4.0)
MCH: 27.5 pg (ref 26.0–34.0)
MCHC: 31.6 g/dL (ref 30.0–36.0)
MCV: 86.9 fL (ref 78.0–100.0)
MONO ABS: 0.6 10*3/uL (ref 0.1–1.0)
MONOS PCT: 8 % (ref 3–12)
NEUTROS ABS: 5.4 10*3/uL (ref 1.7–7.7)
NEUTROS PCT: 70 % (ref 43–77)
Platelets: 234 10*3/uL (ref 150–400)
RBC: 5.28 MIL/uL — ABNORMAL HIGH (ref 3.87–5.11)
RDW: 14.4 % (ref 11.5–15.5)
WBC: 7.7 10*3/uL (ref 4.0–10.5)

## 2014-11-11 LAB — POTASSIUM: POTASSIUM: 3.5 mmol/L (ref 3.5–5.1)

## 2014-11-11 LAB — TSH: TSH: 1.39 u[IU]/mL (ref 0.350–4.500)

## 2014-11-11 LAB — CK: Total CK: 635 U/L — ABNORMAL HIGH (ref 38–234)

## 2014-11-11 MED ORDER — AMLODIPINE BESYLATE 10 MG PO TABS
10.0000 mg | ORAL_TABLET | Freq: Every day | ORAL | Status: DC
  Administered 2014-11-12 – 2014-11-14 (×3): 10 mg via ORAL
  Filled 2014-11-11 (×4): qty 1

## 2014-11-11 MED ORDER — LOSARTAN POTASSIUM 50 MG PO TABS
100.0000 mg | ORAL_TABLET | Freq: Every day | ORAL | Status: DC
  Filled 2014-11-11: qty 2

## 2014-11-11 MED ORDER — VENLAFAXINE HCL ER 150 MG PO CP24
150.0000 mg | ORAL_CAPSULE | Freq: Every day | ORAL | Status: DC
  Administered 2014-11-12 – 2014-11-13 (×2): 150 mg via ORAL
  Filled 2014-11-11 (×3): qty 1

## 2014-11-11 MED ORDER — SODIUM CHLORIDE 0.9 % IV SOLN
750.0000 mg | Freq: Two times a day (BID) | INTRAVENOUS | Status: DC
  Administered 2014-11-11 – 2014-11-13 (×6): 750 mg via INTRAVENOUS
  Filled 2014-11-11 (×7): qty 7.5

## 2014-11-11 MED ORDER — INSULIN ASPART 100 UNIT/ML ~~LOC~~ SOLN: 0.0000 [IU] | Freq: Four times a day (QID) | SUBCUTANEOUS | Status: DC

## 2014-11-11 MED ORDER — ONDANSETRON HCL 4 MG/2ML IJ SOLN: 4.0000 mg | Freq: Four times a day (QID) | INTRAMUSCULAR | Status: DC | PRN

## 2014-11-11 MED ORDER — POTASSIUM CHLORIDE 10 MEQ/100ML IV SOLN
10.0000 meq | INTRAVENOUS | Status: DC
  Administered 2014-11-11 (×2): 10 meq via INTRAVENOUS
  Filled 2014-11-11 (×2): qty 100

## 2014-11-11 MED ORDER — SODIUM CHLORIDE 0.9 % IV SOLN
INTRAVENOUS | Status: DC
  Administered 2014-11-11 (×2): via INTRAVENOUS
  Administered 2014-11-11: 125 mL/h via INTRAVENOUS

## 2014-11-11 MED ORDER — ACETAMINOPHEN 325 MG PO TABS
650.0000 mg | ORAL_TABLET | Freq: Four times a day (QID) | ORAL | Status: DC | PRN
  Administered 2014-11-13: 650 mg via ORAL
  Filled 2014-11-11 (×2): qty 2

## 2014-11-11 MED ORDER — ACETAMINOPHEN 650 MG RE SUPP
650.0000 mg | Freq: Four times a day (QID) | RECTAL | Status: DC | PRN
Start: 2014-11-11 — End: 2014-11-14
  Administered 2014-11-11: 650 mg via RECTAL
  Filled 2014-11-11: qty 1

## 2014-11-11 MED ORDER — SODIUM CHLORIDE 0.9 % IJ SOLN
3.0000 mL | Freq: Two times a day (BID) | INTRAMUSCULAR | Status: DC
  Administered 2014-11-11 – 2014-11-13 (×5): 3 mL via INTRAVENOUS

## 2014-11-11 MED ORDER — ONDANSETRON HCL 4 MG PO TABS: 4.0000 mg | ORAL_TABLET | Freq: Four times a day (QID) | ORAL | Status: DC | PRN

## 2014-11-11 NOTE — Progress Notes (Signed)
Patient seen and examined. Admitted earlier today by Dr. Posey Pronto after an unwitnessed fall. Patient is unclear if she lost consciousness or not. She c/o lower back pain and but her L spine xray was negative for acute fracture. T spine not of quality, but no fracture was observed. Hip xray also without abnormality but limited due to patient positioning. Consider CT if she is unable to bear weight to further assess. Will request PT eval. EEG has been ordered to assess for seizure activity. Suspect may need SNF. Will continue to follow.  Domingo Mend, MD [facility] Pager: [phone]

## 2014-11-11 NOTE — Care Management Note (Signed)
Case Management Note  Patient Details  Name: Nancy Bailey MRN: [id number] Date of Birth: 20-Mar-1945  Subjective/Objective:      Patient suffers from weakness, increased falls which impairs their ability to perform daily activities like, ambulating, washing, dressing in the home. A cane, walker will not resolve  issue with performing activities of daily living. A wheelchair will allow patient to safely perform daily activities. Patient is not able to propel themselves in the home using a standard weight wheelchair due to weakness. Patient can self propel in the lightweight wheelchair.  Accessories: elevating leg rests (ELRs), wheel locks, extensions and anti-tippers.     Standing Order Information                    Action/Plan:   Expected Discharge Date:                  Expected Discharge Plan:  Home/Self Care  In-House Referral:     Discharge planning Services  CM Consult  Post Acute Care Choice:    Choice offered to:     DME Arranged:    DME Agency:     HH Arranged:    HH Agency:     Status of Service:  In process, will continue to follow  Medicare Important Message Given:    Date Medicare IM Given:    Medicare IM give by:    Date Additional Medicare IM Given:    Additional Medicare Important Message give by:     If discussed at [place] of Stay Meetings, dates discussed:    Additional Comments:  Dessa Phi, RN 11/11/2014, 11:57 AM

## 2014-11-11 NOTE — Progress Notes (Signed)
Pt. Came in and blood work showed that her K+ level was 3.3, MD ordered 3 runs of K+. After the first two runs were finished the K+ level had come up to 5.5. RN called MD to make aware and was told to stop the third run of K+ and MD put in to get a stat K+ level. Carmela Hurt, RN

## 2014-11-11 NOTE — Consult Note (Addendum)
   [facility] Inpatient Consult   11/11/2014  LOVEY CRUPI 09-08-1945 [id number]  EPIC referral received for [facility] Care Management services. Cross checked for eligibility. Unfortunately, patient is not on the list of Medicare ACO patients [facility] is contracted with. Therefore, patient is not eligible for [facility] Care Management services. Will make inpatient RNCM aware.  Marthenia Rolling, MSN-Ed, RN,BSN [facility] Liaison [phone]

## 2014-11-11 NOTE — Evaluation (Signed)
SLP Cancellation Note  Patient Details Name: Nancy Bailey MRN: [id number] DOB: 02-07-1946   Cancelled treatment:       Reason Eval/Treat Not Completed: Fatigue/lethargy limiting ability to participate  Luanna Salk, [facility] SLP [phone]

## 2014-11-11 NOTE — Progress Notes (Signed)
OT Cancellation Note  Patient Details Name: ZAKYLA TONCHE MRN: [id number] DOB: [birth year]   Cancelled Treatment:    Reason Eval/Treat Not Completed: Fatigue/lethargy limiting ability to participate.  Pt remains lethargic and needed total A +2 with PT.  Will check back.  Gaytha Raybourn 11/11/2014, 2:43 PM  Lesle Chris, OTR/L [phone] 11/11/2014

## 2014-11-11 NOTE — Evaluation (Signed)
Physical Therapy Evaluation Patient Details Name: Nancy Bailey MRN: [id number] DOB: 03/02/1946 Today's Date: 11/11/2014   History of Present Illness  69 y.o. 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  Clinical Impression  Pt admitted with above diagnosis. Pt currently with functional limitations due to the deficits listed below (see PT Problem List).  Pt will benefit from skilled PT to increase their independence and safety with mobility to allow discharge to the venue listed below.  No family present and pt nonverbal today (able to open eyes to command  also nodding yes to questions) as well as lethargic.  Per chart review, pt is from home with son (who works during day).  Pt currently total assist for bed mobility.  Recommend SNF at this time.  If pt d/c home, equipment recommended below as well as 24/7 assist for pt safety.     Follow Up Recommendations SNF;Supervision/Assistance - 24 hour    Equipment Recommendations  Hospital bed;Wheelchair (measurements PT)    Recommendations for Other Services       Precautions / Restrictions Precautions Precautions: Fall      Mobility  Bed Mobility Overal bed mobility: Needs Assistance;+2 for physical assistance Bed Mobility: Supine to Sit;Sit to Supine     Supine to sit: Total assist;+2 for physical assistance;HOB elevated Sit to supine: Total assist;+2 for physical assistance   General bed mobility comments: pt not assisting with transfer, grimacing with movement, pt not responding upon sitting EOB (was previously opening eyes to command and nodding yes) so assisted back to supine (also grimacing with return to supine) Vitals: 131/68 mmHg, 83bpm, 99% room air  Transfers                    Ambulation/Gait                Stairs            Wheelchair Mobility    Modified Rankin (Stroke Patients  Only)       Balance Overall balance assessment: Needs assistance;History of Falls Sitting-balance support: No upper extremity supported;Feet supported Sitting balance-Leahy Scale: Zero Sitting balance - Comments: pt not using UE to self support, forward lean and decreased communication so assisted return to supine                                     Pertinent Vitals/Pain Pain Assessment: Faces Faces Pain Scale: Hurts even more Pain Location: pt unable to provide location Pain Descriptors / Indicators: Grimacing (with mobility) Pain Intervention(s): Repositioned;Limited activity within patient's tolerance    Home Living Family/patient expects to be discharged to:: Private residence Living Arrangements: Children (son)               Additional Comments: pt nonverbal today, lethargic, no family present, per chart from home with son    Prior Function           Comments: per chart review, ambulatory (has cane but doesn't use) however assist for ADLS, son works and pt is alone most of the day     Hand Dominance        Extremity/Trunk Assessment   Upper Extremity Assessment: Difficult to assess due to impaired cognition;Generalized weakness           Lower Extremity Assessment: Generalized weakness;Difficult to assess due to impaired  cognition         Communication   Communication: Receptive difficulties;Expressive difficulties (only nodding yes to questions)  Cognition Arousal/Alertness: Lethargic   Overall Cognitive Status: No family/caregiver present to determine baseline cognitive functioning                      General Comments      Exercises        Assessment/Plan    PT Assessment Patient needs continued PT services  PT Diagnosis Altered mental status;Generalized weakness;Difficulty walking   PT Problem List Decreased strength;Decreased activity tolerance;Decreased mobility;Decreased balance;Decreased  cognition;Pain;Decreased safety awareness;Decreased knowledge of use of DME  PT Treatment Interventions DME instruction;Gait training;Functional mobility training;Patient/family education;Therapeutic activities;Therapeutic exercise;Balance training   PT Goals (Current goals can be found in the Care Plan section) Acute Rehab PT Goals PT Goal Formulation: Patient unable to participate in goal setting Time For Goal Achievement: 11/25/14 Potential to Achieve Goals: Fair    Frequency Min 3X/week   Barriers to discharge        Co-evaluation               End of Session   Activity Tolerance: Patient limited by lethargy Patient left: in bed;with call bell/phone within reach;with nursing/sitter in room Nurse Communication:  (NT assisted with bed mobility)    Functional Assessment Tool Used: clinical judgement Functional Limitation: Mobility: Walking and moving around Mobility: Walking and Moving Around Current Status (Z0092): 100 percent impaired, limited or restricted Mobility: Walking and Moving Around Goal Status (Z3007): At least 40 percent but less than 60 percent impaired, limited or restricted    Time: 1112-1124 PT Time Calculation (min) (ACUTE ONLY): 12 min   Charges:   PT Evaluation $Initial PT Evaluation Tier I: 1 Procedure     PT G Codes:   PT G-Codes **NOT FOR INPATIENT CLASS** Functional Assessment Tool Used: clinical judgement Functional Limitation: Mobility: Walking and moving around Mobility: Walking and Moving Around Current Status (M2263): 100 percent impaired, limited or restricted Mobility: Walking and Moving Around Goal Status (F3545): At least 40 percent but less than 60 percent impaired, limited or restricted    Lois Ostrom,KATHrine E 11/11/2014, 12:49 PM Carmelia Bake, PT, DPT 11/11/2014 Pager: [phone]

## 2014-11-11 NOTE — Clinical Social Work Placement (Signed)
  CLINICAL SOCIAL WORK PLACEMENT  NOTE  Date:  11/11/2014  Patient Details  Name: Nancy Bailey MRN: [id number] Date of Birth: [birth year]  Clinical Social Work is seeking post-discharge placement for this patient at the [place] level of care (*CSW will initial, date and re-position this form in  chart as items are completed):  Yes   Patient/family provided with [facility] Work Department's list of facilities offering this level of care within the geographic area requested by the patient (or if unable, by the patient's family).  Yes   Patient/family informed of their freedom to choose among providers that offer the needed level of care, that participate in Medicare, Medicaid or managed care program needed by the patient, have an available bed and are willing to accept the patient.  Yes   Patient/family informed of [facility]'s ownership interest in [facility] and [facility], as well as of the fact that they are under no obligation to receive care at these facilities.  PASRR submitted to EDS on 11/11/14     PASRR number received on 11/11/14     Existing PASRR number confirmed on       FL2 transmitted to all facilities in geographic area requested by pt/family on 11/11/14     FL2 transmitted to all facilities within larger geographic area on       Patient informed that his/her managed care company has contracts with or will negotiate with certain facilities, including the following:            Patient/family informed of bed offers received.  Patient chooses bed at       Physician recommends and patient chooses bed at      Patient to be transferred to   on  .  Patient to be transferred to facility by       Patient family notified on   of transfer.  Name of family member notified:        PHYSICIAN       Additional Comment:    _______________________________________________ Standley Brooking, LCSW 11/11/2014, 3:18 PM

## 2014-11-11 NOTE — Care Management Note (Signed)
Case Management Note  Patient Details  Name: Nancy Bailey MRN: [id number] Date of Birth: Oct 27, 1945  Subjective/Objective:  Spoke to son Nancy Bailey on phone.Patient @ home w/son.He chose a w/c for home dme. Explained HHC services(intermittent),private duty care services(custodial level-out of pocket expense), & observation status-per medicare does not qualify for 3 day inpt hospital stay for SNF.Nancy Bailey voiced understanding, & appreciated the information ahead of the recommendation.Await PT cons-recommendation.                  Action/Plan:Monitor progress for d/c needs.   Expected Discharge Date:                  Expected Discharge Plan:  Home/Self Care  In-House Referral:     Discharge planning Services  CM Consult  Post Acute Care Choice:    Choice offered to:     DME Arranged:    DME Agency:     HH Arranged:    HH Agency:     Status of Service:  In process, will continue to follow  Medicare Important Message Given:    Date Medicare IM Given:    Medicare IM give by:    Date Additional Medicare IM Given:    Additional Medicare Important Message give by:     If discussed at [place] of Stay Meetings, dates discussed:    Additional Comments:  Dessa Phi, RN 11/11/2014, 11:42 AM

## 2014-11-11 NOTE — Procedures (Signed)
ELECTROENCEPHALOGRAM REPORT  Date of Study: 11/11/2014  Patient's Name: Nancy Bailey MRN: [id number] Date of Birth: March 21, 1945  Referring Provider: Dr. Berle Mull  Clinical History: This is a 69 year old woman with a history of meningioma, Alzheimer's disease, who had an unwitnessed fall.  Medications: levETIRAcetam (KEPPRA) 750 mg in sodium chloride 0.9 % 100 mL IVPB acetaminophen (TYLENOL) tablet 650 mg amLODipine (NORVASC) tablet 10 mg insulin aspart (novoLOG) injection 0-9 Units venlafaxine XR (EFFEXOR-XR) 24 hr capsule 150 mg  Technical Summary: A multichannel digital EEG recording measured by the international 10-20 system with electrodes applied with paste and impedances below 5000 ohms performed as portable with EKG monitoring in an awake and asleep patient.  Hyperventilation and photic stimulation were not performed.  The digital EEG was referentially recorded, reformatted, and digitally filtered in a variety of bipolar and referential montages for optimal display.   Description: The patient is awake and asleep during the recording. She is unresponsive to stimulation. There is a poorly sustained 8-9 Hz posterior dominant rhythm, better formed over the right occipital region. There is a small amount of diffuse 4-6 Hz theta slowing of the background, with additional focal 2-3 Hz delta slowing over the left hemisphere, maximal over the left frontotemporal region. During drowsiness and sleep, there is an increase in theta slowing of the background with poorly formed vertex waves and sleep spindles.  Hyperventilation and photic stimulation were not performed. There were no epileptiform discharges or electrographic seizures seen.    EKG lead was unremarkable.  Impression: This awake and asleep EEG is abnormal due to the presence of: 1. Mild diffuse slowing of the background 2. Additional focal slowing over the left hemisphere, maximal over the left frontotemporal  region  Clinical Correlation of the above findings indicates bilateral cerebral dysfunction that is non-specific in etiology and can be seen with hypoxic/ischemic injury, toxic/metabolic encephalopathies, or medication effect. Additional focal slowing over the left hemisphere indicates focal cerebral dysfunction in this region, suggestive of underlying structural or physiologic abnormality.  The absence of epileptiform discharges does not rule out a clinical diagnosis of epilepsy.  Clinical correlation is advised.   Ellouise Newer, M.D.

## 2014-11-11 NOTE — Clinical Social Work Note (Signed)
Clinical Social Work Assessment  Patient Details  Name: Nancy Bailey MRN: [id number] Date of Birth: 01-Feb-1946  Date of referral:  11/11/14               Reason for consult:  Facility Placement                Permission sought to share information with:  Chartered certified accountant granted to share information::  Yes, Verbal Permission Granted  Name::        Agency::     Relationship::     Contact Information:     Housing/Transportation Living arrangements for the past 2 months:  Single Family Home Source of Information:  Adult Children Patient Interpreter Needed:  None Criminal Activity/Legal Involvement Pertinent to Current Situation/Hospitalization:  No - Comment as needed Significant Relationships:  Adult Children Lives with:  Adult Children Do you feel safe going back to the place where you live?  No Need for family participation in patient care:  Yes (Comment)  Care giving concerns:  CSW reviewed PT evaluation recommending SNF at discharge.    Social Worker assessment / plan:  CSW spoke with patient's son, Al at bedside re: discharge plans. Patient was changed to INPATIENT from OBSERVATION status today, CSW explained to son that patient would need 3 night inpatient stay in hospital for Medicare to cover SNF stay otherwise, stay would be private pay.   Employment status:  Retired Forensic scientist:  [employer] PT Recommendations:  [place] / Referral to community resources:  [place]  Patient/Family's Response to care:  Son is hoping that patient will qualify to stay in hospital until Monday so SNF would be covered under Medicare - however understands that if she does not meet to stay in the hospital - they would either have to pay privately for SNF or patient will return home with her son, Nicki Reaper and they would hire private duty care to stay with her during the day while her son works.   Patient/Family's  Understanding of and Emotional Response to Diagnosis, Current Treatment, and Prognosis:  Patient's son is concerned about patient's mental status - he understands her diagnosis of Alzheimers, but states that she's just "not herself" anymore.   Emotional Assessment Appearance:  Appears stated age Attitude/Demeanor/Rapport:    Affect (typically observed):  Pleasant, Quiet, Calm Orientation:  Oriented to Self Alcohol / Substance use:    Psych involvement (Current and /or in the community):     Discharge Needs  Concerns to be addressed:    Readmission within the last 30 days:    Current discharge risk:    Barriers to Discharge:      Standley Brooking, LCSW 11/11/2014, 3:12 PM

## 2014-11-11 NOTE — Care Management Note (Signed)
Case Management Note  Patient Details  Name: Nancy Bailey MRN: [id number] Date of Birth: Nov 04, 1945  Subjective/Objective:[facility] notified of dme w/c needed as opposed to the rw since medicare will only pay for one or the other.                    Action/Plan:   Expected Discharge Date:                  Expected Discharge Plan:  Home/Self Care  In-House Referral:     Discharge planning Services  CM Consult  Post Acute Care Choice:    Choice offered to:     DME Arranged:    DME Agency:     HH Arranged:    HH Agency:     Status of Service:  In process, will continue to follow  Medicare Important Message Given:    Date Medicare IM Given:    Medicare IM give by:    Date Additional Medicare IM Given:    Additional Medicare Important Message give by:     If discussed at [place] of Stay Meetings, dates discussed:    Additional Comments:  Dessa Phi, RN 11/11/2014, 11:51 AM

## 2014-11-11 NOTE — Progress Notes (Signed)
EEG completed; results pending.  L. Daryl Makela Niehoff, R.EEGT.  

## 2014-11-11 NOTE — H&P (Addendum)
[facility] History and Physical  Patient: Nancy Bailey  MRN: [id number]  DOB: Oct 05, 1945  DOS: the patient was seen and examined on 11/11/2014 PCP: Jani Gravel, MD  Referring physician: Dr Roderic Palau Chief Complaint: Fall  HPI: Nancy Bailey is a 69 y.o. female with Past medical history of unresectable meningioma on close surveillance, Alzheimer disease, diabetes mellitus. The patient is presenting with an unwitnessed fall. The patient does not have good historian as she does not remember the event and does not communicate clearly in full sentences. Her answers are short yes and no. The patient lives with her son. As per documents available from [place], patient's primary caregiver is Nancy Bailey patient's sister, although the contact information is not available in our Epic. Patient lives at home alone for roughly 8 hours while her son is working. Reportedly today with her son was not able to contact her from work and therefore he requested his elder brother to visit the patient will follow the patient at home on the ground, incontinent of her urine. Although she was conscious and was aware of her surroundings. There was no tonic-clonic movements. The patient was significantly weak and was having difficulty standing up on her own while she was reportedly ambulating on her own and sometimes with cane. From review of the chart the patient was alert awake and oriented and has a progressive decline in her mentation over last few months. There is no recent change in medications reported. Patient was under the process of getting power of attorney for healthcare to [facility] patient's sister  The patient is coming from home.  At her baseline ambulates with support  And is dependent for most of her ADL does manages her medication on her own.  Review of Systems: as mentioned in the history of present illness.  A comprehensive review of the other systems is negative.  Past Medical History  Diagnosis Date    . Cancer     Brain?  . Alzheimer disease   . Diabetes mellitus without complication   . Smoker    History reviewed. No pertinent past surgical history. Social History:  reports that she has been smoking Cigarettes.  She has been smoking about 1.00 pack per day. She has never used smokeless tobacco. She reports that she does not drink alcohol or use illicit drugs.  Allergies  Allergen Reactions  . Codeine Other (See Comments)    hallucinations  . Penicillins Other (See Comments)    headache  . Sulfa Antibiotics Other (See Comments)    headache    History reviewed. No pertinent family history.  Prior to Admission medications   Medication Sig Start Date End Date Taking? Authorizing Provider  amLODipine (NORVASC) 10 MG tablet Take 10 mg by mouth daily.   Yes Historical Provider, MD  cycloSPORINE (RESTASIS) 0.05 % ophthalmic emulsion Place 1 drop into both eyes daily as needed.    Yes Historical Provider, MD  levETIRAcetam (KEPPRA) 750 MG tablet Take 1,500 mg by mouth at bedtime.    Yes Historical Provider, MD  losartan (COZAAR) 100 MG tablet Take 100 mg by mouth daily.   Yes Historical Provider, MD  sitaGLIPtin (JANUVIA) 100 MG tablet Take 100 mg by mouth daily.   Yes Historical Provider, MD  tamsulosin (FLOMAX) 0.4 MG CAPS capsule Take 0.4 mg by mouth daily.   Yes Historical Provider, MD  venlafaxine XR (EFFEXOR-XR) 150 MG 24 hr capsule Take 150 mg by mouth at bedtime.   Yes Historical Provider, MD  Physical Exam: Filed Vitals:   11/11/14 0000 11/11/14 0022 11/11/14 0100 11/11/14 0217  BP: 174/94 174/94 139/94 149/84  Pulse:  96 92 85  Temp:    99.3 F (37.4 C)  TempSrc:    Oral  Resp: 23 23 22 20   Height:    5\' 9"  (1.753 m)  Weight:    96.843 kg (213 lb 8 oz)  SpO2:  99% 97% 100%    General: Alert, Awake and not oriented but follows command appropriately. Appear in mild distress Eyes: Pupils reactive on the right side. Left eye has exophthalmos.  ENT: Oral Mucosa  clear moist. Neck: no JVD Cardiovascular: S1 and S2 Present, no Murmur, Peripheral Pulses Present Respiratory: Bilateral Air entry equal and Decreased,  Clear to Auscultation, no Crackles, no wheezes Abdomen: Bowel Sound present, Soft and no tenderness Skin: no Rash Extremities: Trace Pedal edema, no calf tenderness, hard mobile mass on the right trochanteric region probably soft tissue lipoma. Neurologic:  Patient's speech is clear although she is not answering questions appropriately. Follows command appropriately. Bilaterally equal strength in upper extremity. Adequate strength in left lower extremity. Right lower extremity strength 2 x 5. Babinski equivocal. Withdrawal to painful stimuli.  Labs on Admission:  CBC:  Recent Labs Lab 11/10/14 2221  WBC 8.9  NEUTROABS 6.7  HGB 15.8*  HCT 48.2*  MCV 86.1  PLT 222    CMP     Component Value Date/Time   NA 141 11/10/2014 2221   K 3.3* 11/10/2014 2221   CL 105 11/10/2014 2221   CO2 27 11/10/2014 2221   GLUCOSE 102* 11/10/2014 2221   BUN 9 11/10/2014 2221   CREATININE 0.84 11/10/2014 2221   CALCIUM 10.0 11/10/2014 2221   PROT 6.7 04/10/2011 1340   ALBUMIN 4.0 04/10/2011 1340   AST 15 04/10/2011 1340   ALT 18 04/10/2011 1340   ALKPHOS 104 04/10/2011 1340   BILITOT 0.3 04/10/2011 1340   GFRNONAA >60 11/10/2014 2221   GFRAA >60 11/10/2014 2221    No results for input(s): LIPASE, AMYLASE in the last 168 hours.   Recent Labs Lab 11/10/14 2221  CKTOTAL 496*   BNP (last 3 results) No results for input(s): BNP in the last 8760 hours.  ProBNP (last 3 results) No results for input(s): PROBNP in the last 8760 hours.   Radiological Exams on Admission: Ct Head Wo Contrast  11/11/2014   CLINICAL DATA:  Initial evaluation for acute trauma, fall. History of meningioma.  EXAM: CT HEAD WITHOUT CONTRAST  CT CERVICAL SPINE WITHOUT CONTRAST  TECHNIQUE: Multidetector CT imaging of the head and cervical spine was performed  following the standard protocol without intravenous contrast. Multiplanar CT image reconstructions of the cervical spine were also generated.  COMPARISON:  Prior CT from 10/08/2007.  FINDINGS: CT HEAD FINDINGS  Per history, patient has history of meningioma with involvement of the ethmoid sinus and previous resection of the left orbital roof. Evidence of prior left frontal craniotomy with underlying encephalomalacia.  Extensive mass involving the left ethmoidal air cells with superior extension into the left frontal sinus seen. The tumor now extends intracranial E with involvement of the anteromedial left frontal lobe. The tumor straddles the anterior falx with probable component within the medial right frontal lobe. There is greater lateral extension of the tumor and to the superior aspect of the bony left orbit with associated left-sided proptosis. There is further osseous erosion of the frontal calvarium, likely related to tumor expansion and/or reactive changes. Additionally, there  is possible extension of the tumor inferiorly and laterally into the left middle cranial fossa at the medial and anterior aspect of the left temporal pole (series 9, image 9). It oblique, this may reflect a second meningioma. Exact measurements of this lesion fairly difficult due to its extensive nature and multi focal components. Lesion does measure approximately 8 cm and craniocaudad dimension and 4.4 cm and transverse dimension.  There is a second apparent dural-based mass with scattered calcification involving the lateral left temporal lobe (series 9, image 10). This measures approximately 5.3 x 3.2 x 2.4 cm. There is underlying osseous erosion with extension through the aberrant table. Finding likely reflects an additional meningioma. It is unclear whether this is contiguous with the main mass or represents a separate lesion.  There is a predominantly cystic mass within the left occipital lobe that measures 2.8 x 1.8 x 2.5 cm  (series 9, image 14). Localized vasogenic edema about this lesion. Finding concerning for possible second primary CNS neoplasm.  Extensive hypodensity throughout the left cerebral hemisphere, likely a combination of vasogenic edema and post radiation changes. There is 1 cm of left-to-right midline shift at the level of the septum pellucidum with partial effacement of the left lateral ventricle. Basilar cistern crowding present. No hydrocephalus.  No acute large vessel territory infarct. No intracranial hemorrhage. No extra-axial fluid collection.  Scalp soft tissues demonstrate no acute abnormality. Left-sided proptosis as above. No other acute abnormality about the orbits.  Left maxillary sinus and left sphenoid sinus are largely opacified, likely postobstructive related to the mass. Right-sided paranasal sinuses are clear. Left mastoid effusion present, likely chronic. Right mastoid air cells clear.  CT CERVICAL SPINE FINDINGS  Vertebral bodies are normally aligned with preservation of the normal cervical lordosis. Vertebral body heights maintained. Normal C1-2 articulations preserved. No acute fracture or listhesis. Prevertebral soft tissues within normal limits.  Moderate degenerative spondylolysis present at C3-4 and C4-5. Additional more mild degenerative changes scattered throughout the cervical spine.  No acute soft tissue abnormality within the neck. Vascular calcifications present about the low left carotid bifurcation.  Visualized lung apices are clear.  IMPRESSION: CT BRAIN:  1. No acute traumatic injury within the brain. 2. Marked interval enlargement of left ethmoidal sinus and anterior skullbase mass, with increased extension into the bony left orbit, with new intracranial extension into both the left and right frontal lobes. Possible extension into the left middle cranial fossa versus separate meningioma. Per history, patient has a known unresectable meningioma. 3. Second 5.3 x 3.2 x 2.4 cm dural  based mass at the lateral left temporal lobe, suspicious for a second meningioma. This lesion may in fact be contiguous with the first meningioma within the anteromedial left middle cranial fossa, although this is not well delineated on this exam. 4. 2.8 x 1.8 x 2.5 cm predominately cystic mass in the left occipital lobe, indeterminate, but worrisome for possible second primary CNS neoplasm. Further evaluation with dedicated brain MRI, with and without contrast is recommended if not already performed. 5. Extensive hypodensity throughout the left cerebral hemisphere, likely combination of vasogenic edema and post radiation changes. There is associated 1 cm of left-to-right midline shift with partial effacement a left lateral ventricle and crowding of the basilar cisterns. CT CERVICAL SPINE:  1. No acute traumatic injury within the cervical spine. 2. Moderate degenerative spondylolysis at C3-4 and C4-5.   Electronically Signed   By: Jeannine Boga M.D.   On: 11/11/2014 00:21   Ct Cervical Spine Wo  Contrast  11/11/2014   CLINICAL DATA:  Initial evaluation for acute trauma, fall. History of meningioma.  EXAM: CT HEAD WITHOUT CONTRAST  CT CERVICAL SPINE WITHOUT CONTRAST  TECHNIQUE: Multidetector CT imaging of the head and cervical spine was performed following the standard protocol without intravenous contrast. Multiplanar CT image reconstructions of the cervical spine were also generated.  COMPARISON:  Prior CT from 10/08/2007.  FINDINGS: CT HEAD FINDINGS  Per history, patient has history of meningioma with involvement of the ethmoid sinus and previous resection of the left orbital roof. Evidence of prior left frontal craniotomy with underlying encephalomalacia.  Extensive mass involving the left ethmoidal air cells with superior extension into the left frontal sinus seen. The tumor now extends intracranial E with involvement of the anteromedial left frontal lobe. The tumor straddles the anterior falx with  probable component within the medial right frontal lobe. There is greater lateral extension of the tumor and to the superior aspect of the bony left orbit with associated left-sided proptosis. There is further osseous erosion of the frontal calvarium, likely related to tumor expansion and/or reactive changes. Additionally, there is possible extension of the tumor inferiorly and laterally into the left middle cranial fossa at the medial and anterior aspect of the left temporal pole (series 9, image 9). It oblique, this may reflect a second meningioma. Exact measurements of this lesion fairly difficult due to its extensive nature and multi focal components. Lesion does measure approximately 8 cm and craniocaudad dimension and 4.4 cm and transverse dimension.  There is a second apparent dural-based mass with scattered calcification involving the lateral left temporal lobe (series 9, image 10). This measures approximately 5.3 x 3.2 x 2.4 cm. There is underlying osseous erosion with extension through the aberrant table. Finding likely reflects an additional meningioma. It is unclear whether this is contiguous with the main mass or represents a separate lesion.  There is a predominantly cystic mass within the left occipital lobe that measures 2.8 x 1.8 x 2.5 cm (series 9, image 14). Localized vasogenic edema about this lesion. Finding concerning for possible second primary CNS neoplasm.  Extensive hypodensity throughout the left cerebral hemisphere, likely a combination of vasogenic edema and post radiation changes. There is 1 cm of left-to-right midline shift at the level of the septum pellucidum with partial effacement of the left lateral ventricle. Basilar cistern crowding present. No hydrocephalus.  No acute large vessel territory infarct. No intracranial hemorrhage. No extra-axial fluid collection.  Scalp soft tissues demonstrate no acute abnormality. Left-sided proptosis as above. No other acute abnormality about  the orbits.  Left maxillary sinus and left sphenoid sinus are largely opacified, likely postobstructive related to the mass. Right-sided paranasal sinuses are clear. Left mastoid effusion present, likely chronic. Right mastoid air cells clear.  CT CERVICAL SPINE FINDINGS  Vertebral bodies are normally aligned with preservation of the normal cervical lordosis. Vertebral body heights maintained. Normal C1-2 articulations preserved. No acute fracture or listhesis. Prevertebral soft tissues within normal limits.  Moderate degenerative spondylolysis present at C3-4 and C4-5. Additional more mild degenerative changes scattered throughout the cervical spine.  No acute soft tissue abnormality within the neck. Vascular calcifications present about the low left carotid bifurcation.  Visualized lung apices are clear.  IMPRESSION: CT BRAIN:  1. No acute traumatic injury within the brain. 2. Marked interval enlargement of left ethmoidal sinus and anterior skullbase mass, with increased extension into the bony left orbit, with new intracranial extension into both the left and right frontal  lobes. Possible extension into the left middle cranial fossa versus separate meningioma. Per history, patient has a known unresectable meningioma. 3. Second 5.3 x 3.2 x 2.4 cm dural based mass at the lateral left temporal lobe, suspicious for a second meningioma. This lesion may in fact be contiguous with the first meningioma within the anteromedial left middle cranial fossa, although this is not well delineated on this exam. 4. 2.8 x 1.8 x 2.5 cm predominately cystic mass in the left occipital lobe, indeterminate, but worrisome for possible second primary CNS neoplasm. Further evaluation with dedicated brain MRI, with and without contrast is recommended if not already performed. 5. Extensive hypodensity throughout the left cerebral hemisphere, likely combination of vasogenic edema and post radiation changes. There is associated 1 cm of  left-to-right midline shift with partial effacement a left lateral ventricle and crowding of the basilar cisterns. CT CERVICAL SPINE:  1. No acute traumatic injury within the cervical spine. 2. Moderate degenerative spondylolysis at C3-4 and C4-5.   Electronically Signed   By: Jeannine Boga M.D.   On: 11/11/2014 00:21   EKG: Independently reviewed. normal sinus rhythm, nonspecific ST and T waves changes, prolonged QT interval.  Assessment/Plan Principal Problem:   Fall Active Problems:   Meningioma, multiple   Altered mental status   Seizure disorder   Hypokalemia   Essential hypertension   Diabetes mellitus, type 2   1. Fall The patient is presenting with a fall. The following was not witnessed. The patient was soiling urine as per family. CT of the head shows progression of the disease meningioma. No acute intracranial bleeding. She does complain of some pain on movement of right lower extremity therefore I would check lumbar spine as well as a right hip x-ray. Reportedly she was able to ambulate but now she is not able to. Will check thoracic spine as well.  2. Cerebral meningioma. Next and the patient has multiple meningioma which are not operative table. The patient did have radiation back in 2013. The patient was on Avapro stain which was stopped in November due to intracranial hemorrhage. The hemorrhage is currently resolved. Recent MRI on 10/20/2014 showed progression of the disease and her oncologist and the patient had decided to monitor for close surveillance for 3 more months and had a repeat follow-up later on. Patient was in [place] which I would continue. I will check EEG in the morning to rule out nonconvulsive status epilepticus.  3. Difficulty ambulating. Etiology currently unclear we will await for the x-rays. PTOT consultation. Social Engineer, maintenance (IT). I would also check speech evaluation to rule out any dysphagia  4. Essential  hypertension. Continue home medications.  5. Diabetes mellitus. Holding metformin and placing her on sliding scale.  6. Hypokalemia. Replacing and rechecking  Advance goals of care discussion: Full code as per discussion with patient's elder son, this was discussed based on listing of medical emergency contact in demographics. presumed full code based on lack of other available data. As per [facility] documentation, patient was in the process of getting healthcare Power of attorney with her sister Nancy Bailey whose contact information is not available at present.  Consults: PTOT and speech  DVT Prophylaxis: mechanical compression device Nutrition: Nothing by mouth  Disposition: Admitted as observation, telemetry unit.  Author: Berle Mull, MD [facility] Pager: [phone] 11/11/2014  If 7PM-7AM, please contact night-coverage [email or web address] Password [email or web address]

## 2014-11-11 NOTE — Care Management Note (Signed)
Case Management Note  Patient Details  Name: Nancy Bailey MRN: [id number] Date of Birth: [birth year]  Subjective/Objective: 69 y/o f admitted w/fall. From home.Noted ordered for DME. [facility] dme rep Cyril Mourning aware of orders, informed since currently obsv anticipate d/c.                   Action/Plan:d/c plan home w/DME.   Expected Discharge Date:                  Expected Discharge Plan:  Home/Self Care  In-House Referral:     Discharge planning Services  CM Consult  Post Acute Care Choice:    Choice offered to:     DME Arranged:    DME Agency:     HH Arranged:    HH Agency:     Status of Service:  In process, will continue to follow  Medicare Important Message Given:    Date Medicare IM Given:    Medicare IM give by:    Date Additional Medicare IM Given:    Additional Medicare Important Message give by:     If discussed at [place] of Stay Meetings, dates discussed:    Additional Comments:  Dessa Phi, RN 11/11/2014, 10:33 AM

## 2014-11-12 DIAGNOSIS — I10 Essential (primary) hypertension: Secondary | ICD-10-CM

## 2014-11-12 DIAGNOSIS — W19XXXD Unspecified fall, subsequent encounter: Secondary | ICD-10-CM

## 2014-11-12 DIAGNOSIS — R400 Somnolence: Secondary | ICD-10-CM

## 2014-11-12 DIAGNOSIS — E11649 Type 2 diabetes mellitus with hypoglycemia without coma: Secondary | ICD-10-CM

## 2014-11-12 DIAGNOSIS — R9431 Abnormal electrocardiogram [ECG] [EKG]: Secondary | ICD-10-CM | POA: Diagnosis present

## 2014-11-12 LAB — BASIC METABOLIC PANEL
ANION GAP: 5 (ref 5–15)
BUN: 9 mg/dL (ref 6–20)
CALCIUM: 8.9 mg/dL (ref 8.9–10.3)
CO2: 23 mmol/L (ref 22–32)
Chloride: 113 mmol/L — ABNORMAL HIGH (ref 101–111)
Creatinine, Ser: 0.63 mg/dL (ref 0.44–1.00)
GLUCOSE: 111 mg/dL — AB (ref 65–99)
POTASSIUM: 2.7 mmol/L — AB (ref 3.5–5.1)
Sodium: 141 mmol/L (ref 135–145)

## 2014-11-12 LAB — GLUCOSE, CAPILLARY
GLUCOSE-CAPILLARY: 100 mg/dL — AB (ref 65–99)
GLUCOSE-CAPILLARY: 108 mg/dL — AB (ref 65–99)
Glucose-Capillary: 116 mg/dL — ABNORMAL HIGH (ref 65–99)
Glucose-Capillary: 67 mg/dL (ref 65–99)
Glucose-Capillary: 69 mg/dL (ref 65–99)

## 2014-11-12 LAB — CK: CK TOTAL: 304 U/L — AB (ref 38–234)

## 2014-11-12 LAB — MAGNESIUM: MAGNESIUM: 1.9 mg/dL (ref 1.7–2.4)

## 2014-11-12 MED ORDER — DEXTROSE-NACL 5-0.9 % IV SOLN
INTRAVENOUS | Status: DC
  Administered 2014-11-12: 07:00:00 via INTRAVENOUS

## 2014-11-12 MED ORDER — INSULIN ASPART 100 UNIT/ML ~~LOC~~ SOLN: 0.0000 [IU] | Freq: Three times a day (TID) | SUBCUTANEOUS | Status: DC

## 2014-11-12 MED ORDER — POTASSIUM CHLORIDE IN NACL 40-0.9 MEQ/L-% IV SOLN
INTRAVENOUS | Status: DC
  Administered 2014-11-12 – 2014-11-14 (×5): 75 mL/h via INTRAVENOUS
  Filled 2014-11-12 (×6): qty 1000

## 2014-11-12 MED ORDER — DEXTROSE 50 % IV SOLN
1.0000 | Freq: Once | INTRAVENOUS | Status: AC
  Administered 2014-11-12: 50 mL via INTRAVENOUS
  Filled 2014-11-12: qty 50

## 2014-11-12 MED ORDER — POTASSIUM CHLORIDE CRYS ER 20 MEQ PO TBCR
40.0000 meq | EXTENDED_RELEASE_TABLET | Freq: Two times a day (BID) | ORAL | Status: AC
  Administered 2014-11-12 (×2): 40 meq via ORAL
  Filled 2014-11-12 (×2): qty 2

## 2014-11-12 MED ORDER — ZOLPIDEM TARTRATE 5 MG PO TABS
5.0000 mg | ORAL_TABLET | Freq: Every evening | ORAL | Status: DC | PRN
  Administered 2014-11-12 – 2014-11-13 (×2): 5 mg via ORAL
  Filled 2014-11-12 (×2): qty 1

## 2014-11-12 NOTE — Progress Notes (Signed)
CRITICAL VALUE ALERT  Critical value received:  CBG 67  Date of notification:  11/12/14  Time of notification: 0642  Critical value read back:yes  Nurse who received alert:   Dellie Catholic  MD notified (1st page):  yes  Time of first page:  [phone]

## 2014-11-12 NOTE — Evaluation (Signed)
Clinical/Bedside Swallow Evaluation Patient Details  Name: Nancy Bailey MRN: [id number] Date of Birth: 15-Aug-1945  Today's Date: 11/12/2014 Time: SLP Start Time (ACUTE ONLY): 1221 SLP Stop Time (ACUTE ONLY): 1240 SLP Time Calculation (min) (ACUTE ONLY): 19 min  Past Medical History:  Past Medical History  Diagnosis Date  . Cancer     Brain?  . Alzheimer disease   . Diabetes mellitus without complication   . Smoker    Past Surgical History: History reviewed. No pertinent past surgical history. HPI:  Nancy Bailey is an 69 y.o. female the PMH of unresectable meningioma on close surveillance, Alzheimer's disease and diabetes who was admitted on 11/10/14 after suffering from an unwitnessed fall at home. She was incontinent of urine when found down, but was conscious and aware of her surroundings. Status post CT scan of the head which showed progression of disease. No intracranial bleeding   Assessment / Plan / Recommendation Clinical Impression  Pt presents with mild oral dysphagia which is impacted by her baseline cognitive deficits and edentulous oral cavity. Swallow initiation slightly delayed felt to be secondary to distractibilty and reduced mentation. Despite cognitive deficits,  pt appeared to be adequately protecting airway. Noted ineffective mastication of solids. Extracted bacon bolus from oral cavity 4 hours post breakfast meal. Recommend diet downgrade to dysphagia 2 (chopped) and thin liquids. ST to follow up for diet tolerance. RN reports tolerance of medicines whole with thin liquid. Full supervision recommended with all PO based upon severity of cognitive deficits which increase aspiration risk.     Aspiration Risk  Mild    Diet Recommendation Dysphagia 2 (Fine chop);Thin   Medication Administration: Whole meds with liquid Compensations: Minimize environmental distractions;Small sips/bites;Slow rate    Other  Recommendations Oral Care Recommendations: Oral care BID    Follow Up Recommendations       Frequency and Duration min 1 x/week  1 week   Pertinent Vitals/Pain     SLP Swallow Goals     Swallow Study Prior Functional Status       General Date of Onset: 11/10/14 Other Pertinent Information: Nancy Bailey is an 69 y.o. female the PMH of unresectable meningioma on close surveillance, Alzheimer's disease and diabetes who was admitted on 11/10/14 after suffering from an unwitnessed fall at home. She was incontinent of urine when found down, but was conscious and aware of her surroundings. Status post CT scan of the head which showed progression of disease. No intracranial bleeding Type of Study: Bedside swallow evaluation Diet Prior to this Study: Regular;Thin liquids Temperature Spikes Noted: Yes Respiratory Status: Room air History of Recent Intubation: No Behavior/Cognition: Confused;Doesn't follow directions;Alert Oral Cavity - Dentition: Edentulous Self-Feeding Abilities: Needs assist;Able to feed self Patient Positioning: Upright in bed Baseline Vocal Quality: Normal Volitional Cough: Cognitively unable to elicit Volitional Swallow: Unable to elicit    Oral/Motor/Sensory Function Overall Oral Motor/Sensory Function: Appears within functional limits for tasks assessed   Ice Chips Ice chips: Within functional limits Presentation: Spoon   Thin Liquid Thin Liquid: Within functional limits Presentation: Cup;Straw (pt unable to successfully use straw 2/2 cognitive deficits)    Nectar Thick Nectar Thick Liquid: Not tested   Honey Thick Honey Thick Liquid: Not tested   Puree Puree: Within functional limits   Solid   GO    Solid: Impaired Oral Phase Impairments: Impaired mastication;Impaired anterior to posterior transit      Arvil Chaco MA, CCC-SLP Acute Care Speech Language Pathologist    [place], Vikki Ports  E 11/12/2014,12:44 PM

## 2014-11-12 NOTE — Progress Notes (Signed)
Writer received critical alert of K+ 2.7 MD is in pt room and aware of lab.  Orders received.

## 2014-11-12 NOTE — Clinical Social Work Note (Signed)
CSW called and spoke with both of pt's sons regarding pt rehab at discharge  Pt lives with son Nicki Reaper and he stated that his brother Al would be at hospital today and could obtain list from [facility] He is still trying to decide whether he wants in home PT or in patient rehab.  To be determined closer to discharge  Pt's son Al stated that he would be at hospital at 1:30 today and would call CSW to meet with her and discuss rehab list.  .Dede Query, [facility] Worker - Weekend Coverage cell #: [phone]

## 2014-11-12 NOTE — Evaluation (Signed)
Occupational Therapy Evaluation Patient Details Name: Nancy Bailey MRN: [id number] DOB: Dec 03, 1945 Today's Date: 11/12/2014    History of Present Illness 69 y.o. 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   Clinical Impression   Pt admitted with fall. Pt currently with functional limitations due to the deficits listed below (see OT Problem List).  Pt will benefit from skilled OT to increase their safety and independence with ADL and functional mobility for ADL to facilitate discharge to venue listed below.      Follow Up Recommendations  SNF    Equipment Recommendations  None recommended by OT       Precautions / Restrictions Precautions Precautions: Fall      Mobility Bed Mobility Overal bed mobility: Needs Assistance;+2 for physical assistance Bed Mobility: Rolling;Supine to Sit;Sit to Supine Rolling: Max assist   Supine to sit: Total assist;+2 for physical assistance;HOB elevated Sit to supine: +2 for physical assistance;Total assist   General bed mobility comments: pt having trouble following commands- when OT ask pt to move Left pt scooted right  Transfers Overall transfer level: Needs assistance Equipment used: 2 person hand held assist Transfers: Sit to/from Stand Sit to Stand: Max assist;+2 safety/equipment;+2 physical assistance              Balance Overall balance assessment: Needs assistance Sitting-balance support: Bilateral upper extremity supported Sitting balance-Leahy Scale: Poor                                      ADL Overall ADL's : Needs assistance/impaired Eating/Feeding: Minimal assistance;Sitting   Grooming: Moderate assistance;Sitting                                 General ADL Comments: pt able to sit EOB for approx 10 minutes with mod A               Pertinent Vitals/Pain Pain  Assessment: No/denies pain     Hand Dominance     Extremity/Trunk Assessment Upper Extremity Assessment Upper Extremity Assessment: Generalized weakness;Difficult to assess due to impaired cognition           Communication Communication Communication: Receptive difficulties;Expressive difficulties (only nodding yes to questions)   Cognition Arousal/Alertness: Lethargic   Overall Cognitive Status: No family/caregiver present to determine baseline cognitive functioning                                Home Living Family/patient expects to be discharged to:: Private residence Living Arrangements: Children (son)                               Additional Comments: does not know answers to OT's questions      Prior Functioning/Environment          Comments: per chart review, ambulatory (has cane but doesn't use) however assist for ADLS, son works and pt is alone most of the day    OT Diagnosis: Generalized weakness;Cognitive deficits   OT Problem List: Decreased strength;Decreased activity tolerance;Impaired balance (sitting and/or standing);Decreased safety awareness;Decreased cognition   OT Treatment/Interventions: Self-care/ADL training;DME and/or AE instruction;Patient/family education    OT Goals(Current goals can  be found in the care plan section) Acute Rehab OT Goals Patient Stated Goal: did not state Time For Goal Achievement: 11/26/14  OT Frequency: Min 2X/week   Barriers to D/C: Decreased caregiver support             End of Session    Activity Tolerance: Patient limited by fatigue Patient left: in bed;with call bell/phone within reach;Other (comment) (with SLP)   Time: 4665-9935 OT Time Calculation (min): 24 min Charges:  OT General Charges $OT Visit: 1 Procedure OT Evaluation $Initial OT Evaluation Tier I: 1 Procedure OT Treatments $Self Care/Home Management : 8-22 mins G-Codes:    Betsy Pries 2014-11-23, 12:56  PM

## 2014-11-12 NOTE — Progress Notes (Signed)
CRITICAL VALUE ALERT  Critical value received:  CBG 69  Date of notification:  11/12/14  Time of notification: 0048  Critical value read back:yes  Nurse who received alert: Dellie Catholic  MD notified (1st page):  Fredirick Maudlin FNP  Time of first page:  [phone]

## 2014-11-12 NOTE — Progress Notes (Addendum)
Progress Note   VONNETTA AKEY KCL:[id number] DOB: September 19, 1945 DOA: 11/10/2014 PCP: Jani Gravel, MD   Brief Narrative:   Nancy Bailey is an 69 y.o. female the PMH of unresectable meningioma on close surveillance, Alzheimer's disease and diabetes who was admitted on 11/10/14 after suffering from an unwitnessed fall at home. She was incontinent of urine when found down, but was conscious and aware of her surroundings.  Assessment/Plan:   Principal Problem:   Fall in the setting of unresectable meningioma - Status post CT scan of the head which showed progression of disease. No intracranial bleeding. - The patient is on Keppra for seizure prophylaxis. EEG done 11/11/14, negative for epileptiform discharges. - Seen by PT 11/11/14 with recommendations for SNF versus 24 hour supervision at home. - Multiple x-rays as noted below, negative for fractures. U/A negative for nitrites/leukocytes. - PT/OT evaluations requested.  Active Problems:   Prolonged QTc - QTc 569.  ? Had an arrhythmia given electrolyte abnormalities. - Monitor on tele, replete electrolytes, avoid medication that can further prolong QTc.    Hypokalemia - Replete.  Check magnesium.    Mild rhabdomyolysis - Recheck CK levels. Continue to gently hydrate.    Meningioma, multiple - Progression of disease noted on CT scan. - Status post stereotactic radiation treatment back in 2013.    Acute encephalopathy - EEG findings consistent with encephalopathy. - Follow-up speech therapy recommendations, unable to participate in evaluation on 11/11/14. - PT evaluation pending.    Seizure disorder - Continue Keppra.    Essential hypertension - Continue amlodipine and Cozaar.    Diabetes mellitus, type 2 / hypoglycemia - Januvia currently on hold. Being managed with SSI, sensitive scale every 6 hours. CBGs 67-116. - Advance diet and change SSI to Q AC/HS. Had mild hypoglycemia overnight.  Diet advanced.    DVT Prophylaxis -  TED hose.  Family Communication: No family at bedside.  Son [place], [phone], updated by telephone. Disposition Plan: Home with 24 hour supervision vs SNF, lives with son. Code Status:     Code Status Orders        Start     Ordered   11/11/14 0305  Full code   Continuous     11/11/14 0310        IV Access:    Peripheral IV   Procedures and diagnostic studies:   Dg Thoracic Spine 2 View  11/11/2014   CLINICAL DATA:  Patient status post fall in shower 2 days prior. Initial encounter.  EXAM: THORACIC SPINE 2 VIEWS  COMPARISON:  Chest radiograph 10/12/2007  FINDINGS: Markedly limited examination due to difficulty with patient positioning. The visualized mid aspect of the thoracic spine demonstrates multilevel degenerative disc disease with anterior endplate osteophytosis. No definite displaced fracture involving the mid aspect of the thoracic spine however the entire thoracic spine is not able to be adequately visualized.  IMPRESSION: Markedly limited exam due to difficulty patient positioning. The mid aspect of the thoracic spine demonstrates multilevel degenerative changes without definite evidence for displaced fracture. Entire thoracic spine is not visualized and therefore not assessed. If there concern for thoracic spine injury/fracture, further evaluation with thoracic spine CT would be recommended.   Electronically Signed   By: Lovey Newcomer M.D.   On: 11/11/2014 08:54   Dg Lumbar Spine 2-3 Views  11/11/2014   CLINICAL DATA:  Alzheimer's, diabetes mellitus, fell 2 days ago in shower at home, LEFT hip pain  EXAM: LUMBAR SPINE - 2-3  VIEW  COMPARISON:  CT abdomen pelvis 08/13/2012  FINDINGS: Osseous demineralization.  Five non-rib-bearing lumbar vertebra.  Disc space narrowing and endplate spur formation at lower thoracic spine and T12-L1.  Vertebral body heights maintained.  No definite fracture, subluxation or bone destruction.  Facet degenerative changes lower lumbar spine.  Surgical  clips RIGHT upper quadrant.  Visualized portion of pelvis intact.  IMPRESSION: Osseous demineralization with degenerative disc disease changes of the lower thoracic spine and at the thoracolumbar junction.  Facet degenerative changes lower lumbar spine.  No acute abnormalities.   Electronically Signed   By: Lavonia Dana M.D.   On: 11/11/2014 08:58   Ct Head Wo Contrast  11/11/2014   CLINICAL DATA:  Initial evaluation for acute trauma, fall. History of meningioma.  EXAM: CT HEAD WITHOUT CONTRAST  CT CERVICAL SPINE WITHOUT CONTRAST  TECHNIQUE: Multidetector CT imaging of the head and cervical spine was performed following the standard protocol without intravenous contrast. Multiplanar CT image reconstructions of the cervical spine were also generated.  COMPARISON:  Prior CT from 10/08/2007.  FINDINGS: CT HEAD FINDINGS  Per history, patient has history of meningioma with involvement of the ethmoid sinus and previous resection of the left orbital roof. Evidence of prior left frontal craniotomy with underlying encephalomalacia.  Extensive mass involving the left ethmoidal air cells with superior extension into the left frontal sinus seen. The tumor now extends intracranial E with involvement of the anteromedial left frontal lobe. The tumor straddles the anterior falx with probable component within the medial right frontal lobe. There is greater lateral extension of the tumor and to the superior aspect of the bony left orbit with associated left-sided proptosis. There is further osseous erosion of the frontal calvarium, likely related to tumor expansion and/or reactive changes. Additionally, there is possible extension of the tumor inferiorly and laterally into the left middle cranial fossa at the medial and anterior aspect of the left temporal pole (series 9, image 9). It oblique, this may reflect a second meningioma. Exact measurements of this lesion fairly difficult due to its extensive nature and multi focal  components. Lesion does measure approximately 8 cm and craniocaudad dimension and 4.4 cm and transverse dimension.  There is a second apparent dural-based mass with scattered calcification involving the lateral left temporal lobe (series 9, image 10). This measures approximately 5.3 x 3.2 x 2.4 cm. There is underlying osseous erosion with extension through the aberrant table. Finding likely reflects an additional meningioma. It is unclear whether this is contiguous with the main mass or represents a separate lesion.  There is a predominantly cystic mass within the left occipital lobe that measures 2.8 x 1.8 x 2.5 cm (series 9, image 14). Localized vasogenic edema about this lesion. Finding concerning for possible second primary CNS neoplasm.  Extensive hypodensity throughout the left cerebral hemisphere, likely a combination of vasogenic edema and post radiation changes. There is 1 cm of left-to-right midline shift at the level of the septum pellucidum with partial effacement of the left lateral ventricle. Basilar cistern crowding present. No hydrocephalus.  No acute large vessel territory infarct. No intracranial hemorrhage. No extra-axial fluid collection.  Scalp soft tissues demonstrate no acute abnormality. Left-sided proptosis as above. No other acute abnormality about the orbits.  Left maxillary sinus and left sphenoid sinus are largely opacified, likely postobstructive related to the mass. Right-sided paranasal sinuses are clear. Left mastoid effusion present, likely chronic. Right mastoid air cells clear.  CT CERVICAL SPINE FINDINGS  Vertebral bodies are normally  aligned with preservation of the normal cervical lordosis. Vertebral body heights maintained. Normal C1-2 articulations preserved. No acute fracture or listhesis. Prevertebral soft tissues within normal limits.  Moderate degenerative spondylolysis present at C3-4 and C4-5. Additional more mild degenerative changes scattered throughout the cervical  spine.  No acute soft tissue abnormality within the neck. Vascular calcifications present about the low left carotid bifurcation.  Visualized lung apices are clear.  IMPRESSION: CT BRAIN:  1. No acute traumatic injury within the brain. 2. Marked interval enlargement of left ethmoidal sinus and anterior skullbase mass, with increased extension into the bony left orbit, with new intracranial extension into both the left and right frontal lobes. Possible extension into the left middle cranial fossa versus separate meningioma. Per history, patient has a known unresectable meningioma. 3. Second 5.3 x 3.2 x 2.4 cm dural based mass at the lateral left temporal lobe, suspicious for a second meningioma. This lesion may in fact be contiguous with the first meningioma within the anteromedial left middle cranial fossa, although this is not well delineated on this exam. 4. 2.8 x 1.8 x 2.5 cm predominately cystic mass in the left occipital lobe, indeterminate, but worrisome for possible second primary CNS neoplasm. Further evaluation with dedicated brain MRI, with and without contrast is recommended if not already performed. 5. Extensive hypodensity throughout the left cerebral hemisphere, likely combination of vasogenic edema and post radiation changes. There is associated 1 cm of left-to-right midline shift with partial effacement a left lateral ventricle and crowding of the basilar cisterns. CT CERVICAL SPINE:  1. No acute traumatic injury within the cervical spine. 2. Moderate degenerative spondylolysis at C3-4 and C4-5.   Electronically Signed   By: Jeannine Boga M.D.   On: 11/11/2014 00:21   Ct Cervical Spine Wo Contrast  11/11/2014   CLINICAL DATA:  Initial evaluation for acute trauma, fall. History of meningioma.  EXAM: CT HEAD WITHOUT CONTRAST  CT CERVICAL SPINE WITHOUT CONTRAST  TECHNIQUE: Multidetector CT imaging of the head and cervical spine was performed following the standard protocol without intravenous  contrast. Multiplanar CT image reconstructions of the cervical spine were also generated.  COMPARISON:  Prior CT from 10/08/2007.  FINDINGS: CT HEAD FINDINGS  Per history, patient has history of meningioma with involvement of the ethmoid sinus and previous resection of the left orbital roof. Evidence of prior left frontal craniotomy with underlying encephalomalacia.  Extensive mass involving the left ethmoidal air cells with superior extension into the left frontal sinus seen. The tumor now extends intracranial E with involvement of the anteromedial left frontal lobe. The tumor straddles the anterior falx with probable component within the medial right frontal lobe. There is greater lateral extension of the tumor and to the superior aspect of the bony left orbit with associated left-sided proptosis. There is further osseous erosion of the frontal calvarium, likely related to tumor expansion and/or reactive changes. Additionally, there is possible extension of the tumor inferiorly and laterally into the left middle cranial fossa at the medial and anterior aspect of the left temporal pole (series 9, image 9). It oblique, this may reflect a second meningioma. Exact measurements of this lesion fairly difficult due to its extensive nature and multi focal components. Lesion does measure approximately 8 cm and craniocaudad dimension and 4.4 cm and transverse dimension.  There is a second apparent dural-based mass with scattered calcification involving the lateral left temporal lobe (series 9, image 10). This measures approximately 5.3 x 3.2 x 2.4 cm. There is underlying  osseous erosion with extension through the aberrant table. Finding likely reflects an additional meningioma. It is unclear whether this is contiguous with the main mass or represents a separate lesion.  There is a predominantly cystic mass within the left occipital lobe that measures 2.8 x 1.8 x 2.5 cm (series 9, image 14). Localized vasogenic edema about  this lesion. Finding concerning for possible second primary CNS neoplasm.  Extensive hypodensity throughout the left cerebral hemisphere, likely a combination of vasogenic edema and post radiation changes. There is 1 cm of left-to-right midline shift at the level of the septum pellucidum with partial effacement of the left lateral ventricle. Basilar cistern crowding present. No hydrocephalus.  No acute large vessel territory infarct. No intracranial hemorrhage. No extra-axial fluid collection.  Scalp soft tissues demonstrate no acute abnormality. Left-sided proptosis as above. No other acute abnormality about the orbits.  Left maxillary sinus and left sphenoid sinus are largely opacified, likely postobstructive related to the mass. Right-sided paranasal sinuses are clear. Left mastoid effusion present, likely chronic. Right mastoid air cells clear.  CT CERVICAL SPINE FINDINGS  Vertebral bodies are normally aligned with preservation of the normal cervical lordosis. Vertebral body heights maintained. Normal C1-2 articulations preserved. No acute fracture or listhesis. Prevertebral soft tissues within normal limits.  Moderate degenerative spondylolysis present at C3-4 and C4-5. Additional more mild degenerative changes scattered throughout the cervical spine.  No acute soft tissue abnormality within the neck. Vascular calcifications present about the low left carotid bifurcation.  Visualized lung apices are clear.  IMPRESSION: CT BRAIN:  1. No acute traumatic injury within the brain. 2. Marked interval enlargement of left ethmoidal sinus and anterior skullbase mass, with increased extension into the bony left orbit, with new intracranial extension into both the left and right frontal lobes. Possible extension into the left middle cranial fossa versus separate meningioma. Per history, patient has a known unresectable meningioma. 3. Second 5.3 x 3.2 x 2.4 cm dural based mass at the lateral left temporal lobe, suspicious  for a second meningioma. This lesion may in fact be contiguous with the first meningioma within the anteromedial left middle cranial fossa, although this is not well delineated on this exam. 4. 2.8 x 1.8 x 2.5 cm predominately cystic mass in the left occipital lobe, indeterminate, but worrisome for possible second primary CNS neoplasm. Further evaluation with dedicated brain MRI, with and without contrast is recommended if not already performed. 5. Extensive hypodensity throughout the left cerebral hemisphere, likely combination of vasogenic edema and post radiation changes. There is associated 1 cm of left-to-right midline shift with partial effacement a left lateral ventricle and crowding of the basilar cisterns. CT CERVICAL SPINE:  1. No acute traumatic injury within the cervical spine. 2. Moderate degenerative spondylolysis at C3-4 and C4-5.   Electronically Signed   By: Jeannine Boga M.D.   On: 11/11/2014 00:21   Dg Hips Bilat With Pelvis 3-4 Views  11/11/2014   CLINICAL DATA:  Patient status post fall.  Initial encounter.  EXAM: DG HIP (WITH OR WITHOUT PELVIS) 3-4V BILAT  COMPARISON:  CT abdomen pelvis 08/13/2012  FINDINGS: Lower lumbar spine degenerative changes. Normal anatomic alignment. No definite evidence for acute fracture or dislocation although evaluation of the left hip is limited due to difficulty with patient positioning.  IMPRESSION: No definite displaced fracture although evaluation of the left hip is limited due to patient positioning. If there is high clinical suspicion for occult fracture or the patient refuses to weightbear, consider further evaluation  with MRI. Although CT is expeditious, evidence is lacking regarding accuracy of CT over plain film radiography.   Electronically Signed   By: Lovey Newcomer M.D.   On: 11/11/2014 09:00     Medical Consultants:    None.  Anti-Infectives:    None.  Subjective:   Haze Rushing tells me she has no recollection of the fall or  how she came here.  No pain.  Denies dyspnea, cough.  Hungry.    Objective:    Filed Vitals:   11/11/14 1347 11/11/14 1938 11/11/14 2316 11/12/14 0441  BP: 114/58 156/79 155/80 158/78  Pulse: 60 62 81 74  Temp: 99.3 F (37.4 C) 99.6 F (37.6 C)  98.3 F (36.8 C)  TempSrc: Oral Oral  Oral  Resp: 18 20  18   Height:      Weight:    97.2 kg (214 lb 4.6 oz)  SpO2: 100% 100%  100%    Intake/Output Summary (Last 24 hours) at 11/12/14 1047 Last data filed at 11/12/14 0444  Gross per 24 hour  Intake  107.5 ml  Output      0 ml  Net  107.5 ml    Exam: Gen:  NAD, groggy but more awake then she was yesterday per RN, disoriented to place Cardiovascular:  RRR, No M/R/G Respiratory:  Lungs CTAB Gastrointestinal:  Abdomen soft, NT/ND, + BS Extremities:  No C/E/C, TEDS hose on   Data Reviewed:    Labs: Basic Metabolic Panel:  Recent Labs Lab 11/10/14 2221 11/11/14 0425 11/11/14 0710 11/12/14 0956  NA 141 137  --  141  K 3.3* 5.5* 3.5 2.7*  CL 105 104  --  113*  CO2 27 21*  --  23  GLUCOSE 102* 115*  --  111*  BUN 9 10  --  9  CREATININE 0.84 0.86  --  0.63  CALCIUM 10.0 9.5  --  8.9   GFR Estimated Creatinine Clearance: 82.4 mL/min (by C-G formula based on Cr of 0.63). Liver Function Tests:  Recent Labs Lab 11/11/14 0425  AST 31  ALT 20  ALKPHOS 122  BILITOT 0.8  PROT 7.1  ALBUMIN 3.8   CBC:  Recent Labs Lab 11/10/14 2221 11/11/14 0425  WBC 8.9 7.7  NEUTROABS 6.7 5.4  HGB 15.8* 14.5  HCT 48.2* 45.9  MCV 86.1 86.9  PLT 222 234   Cardiac Enzymes:  Recent Labs Lab 11/10/14 2221 11/11/14 0425 11/12/14 0956  CKTOTAL 496* 635* 304*   CBG:  Recent Labs Lab 11/11/14 0743 11/11/14 1150 11/12/14 0008 11/12/14 0123 11/12/14 0639  GLUCAP 88 94 69 116* 67   Thyroid function studies:  Recent Labs  11/11/14 0425  TSH 1.390    Microbiology No results found for this or any previous visit (from the past 240 hour(s)).   Medications:    . amLODipine  10 mg Oral Daily  . insulin aspart  0-9 Units Subcutaneous TID WC  . levETIRAcetam  750 mg Intravenous Q12H  . sodium chloride  3 mL Intravenous Q12H  . venlafaxine XR  150 mg Oral QHS   Continuous Infusions: . dextrose 5 % and 0.9% NaCl 100 mL/hr at 11/12/14 0700    Time spent: 35 minutes with > 50% of time discussing current diagnostic test results, clinical impression and plan of care with the patient, who remains confused and with her son by telephone.   LOS: 1 day   Vern Guerette  [facility] Pager [phone]. If unable to reach  me by pager, please call my cell phone at [phone].  *Please refer to [email or web address], password [email or web address] to get updated schedule on who will round on this patient, as hospitalists switch teams weekly. If 7PM-7AM, please contact night-coverage at [email or web address], password [email or web address] for any overnight needs.  11/12/2014, 10:47 AM

## 2014-11-13 DIAGNOSIS — R4702 Dysphasia: Secondary | ICD-10-CM | POA: Diagnosis present

## 2014-11-13 DIAGNOSIS — G9340 Encephalopathy, unspecified: Secondary | ICD-10-CM

## 2014-11-13 DIAGNOSIS — M6282 Rhabdomyolysis: Secondary | ICD-10-CM | POA: Diagnosis present

## 2014-11-13 DIAGNOSIS — E162 Hypoglycemia, unspecified: Secondary | ICD-10-CM | POA: Diagnosis present

## 2014-11-13 LAB — BASIC METABOLIC PANEL
Anion gap: 8 (ref 5–15)
BUN: 9 mg/dL (ref 6–20)
CHLORIDE: 107 mmol/L (ref 101–111)
CO2: 24 mmol/L (ref 22–32)
Calcium: 9.3 mg/dL (ref 8.9–10.3)
Creatinine, Ser: 0.72 mg/dL (ref 0.44–1.00)
GFR calc Af Amer: >60 mL/min (ref >60)
GFR calc non Af Amer: >60 mL/min (ref >60)
GLUCOSE: 110 mg/dL — AB (ref 65–99)
POTASSIUM: 3.4 mmol/L — AB (ref 3.5–5.1)
Sodium: 139 mmol/L (ref 135–145)

## 2014-11-13 LAB — GLUCOSE, CAPILLARY
Glucose-Capillary: 98 mg/dL (ref 65–99)
Glucose-Capillary: 101 mg/dL — ABNORMAL HIGH (ref 65–99)
Glucose-Capillary: 84 mg/dL (ref 65–99)
Glucose-Capillary: 90 mg/dL (ref 65–99)

## 2014-11-13 MED ORDER — POTASSIUM CHLORIDE CRYS ER 20 MEQ PO TBCR
20.0000 meq | EXTENDED_RELEASE_TABLET | Freq: Two times a day (BID) | ORAL | Status: DC
  Administered 2014-11-13 – 2014-11-14 (×3): 20 meq via ORAL
  Filled 2014-11-13 (×3): qty 1

## 2014-11-13 MED ORDER — HYDRALAZINE HCL 20 MG/ML IJ SOLN
10.0000 mg | Freq: Four times a day (QID) | INTRAMUSCULAR | Status: DC | PRN
  Administered 2014-11-13 – 2014-11-14 (×3): 10 mg via INTRAVENOUS
  Filled 2014-11-13 (×3): qty 1

## 2014-11-13 NOTE — Clinical Social Work Note (Signed)
CSW met with pt's son at pt bedside.    CSW explained the SNF list responses and encourage pt's son to ask any questions about SNF and discharge plans  CSW explained that he may make a choice on SNF but depending on when pt discharged the SNF bed would have to be available  Pt's son stated that he would review and make a decision tomorrow.  He stated that he would call CSW  .Dede Query, LCSW [facility] Clinical Social Worker - Weekend Coverage cell #: [phone]

## 2014-11-13 NOTE — Progress Notes (Signed)
Progress Note   LOUANN HOPSON IOX:[id number] DOB: 30-Mar-1945 DOA: 11/10/2014 PCP: Jani Gravel, MD   Brief Narrative:   Nancy Bailey is an 69 y.o. female the PMH of unresectable meningioma on close surveillance, Alzheimer's disease and diabetes who was admitted on 11/10/14 after suffering from an unwitnessed fall at home. She was incontinent of urine when found down, but was conscious and aware of her surroundings.  Assessment/Plan:   Principal Problem:   Fall in the setting of unresectable meningioma - Status post CT scan of the head which showed progression of disease. No intracranial bleeding. - The patient is on Keppra for seizure prophylaxis. EEG done 11/11/14, negative for epileptiform discharges. - Seen by PT 11/11/14 with recommendations for SNF versus 24 hour supervision at home. - Multiple x-rays as noted below, negative for fractures. U/A negative for nitrites/leukocytes. - PT/OT evaluations performed, SNF recommended.  Active Problems:   Mild oral dysphasia - Evaluated by speech therapy. Dysphagia 2 diet recommended.    Prolonged QTc - QTc 569.  ? Had an arrhythmia given electrolyte abnormalities. - Monitor on tele, replete electrolytes, avoid medication that can further prolong QTc. - NSR on tele.    Hypokalemia - Potassium 3.4 today, we'll give an additional 40 mEq of oral potassium.  Magnesium WNL.    Mild rhabdomyolysis - CK levels decreasing with hydration.    Meningioma, multiple - Progression of disease noted on CT scan. - Status post stereotactic radiation treatment back in 2013.    Acute encephalopathy - EEG findings consistent with encephalopathy. - Improved.    Seizure disorder - Continue Keppra.    Essential hypertension - Continue amlodipine and Cozaar.    Diabetes mellitus, type 2 / hypoglycemia - CBGs improved after advancing diet. No further hypoglycemic episodes. - Continue insulin sensitive SSI Q AC/HS.     DVT Prophylaxis - TED  hose.  Family Communication: No family at bedside.  Son [place], [phone], updated by telephone 11/12/14. Disposition Plan: SNF 11/14/14 if bed available. Code Status:     Code Status Orders        Start     Ordered   11/11/14 0305  Full code   Continuous     11/11/14 0310        IV Access:    Peripheral IV   Procedures and diagnostic studies:   Dg Thoracic Spine 2 View  11/11/2014   CLINICAL DATA:  Patient status post fall in shower 2 days prior. Initial encounter.  EXAM: THORACIC SPINE 2 VIEWS  COMPARISON:  Chest radiograph 10/12/2007  FINDINGS: Markedly limited examination due to difficulty with patient positioning. The visualized mid aspect of the thoracic spine demonstrates multilevel degenerative disc disease with anterior endplate osteophytosis. No definite displaced fracture involving the mid aspect of the thoracic spine however the entire thoracic spine is not able to be adequately visualized.  IMPRESSION: Markedly limited exam due to difficulty patient positioning. The mid aspect of the thoracic spine demonstrates multilevel degenerative changes without definite evidence for displaced fracture. Entire thoracic spine is not visualized and therefore not assessed. If there concern for thoracic spine injury/fracture, further evaluation with thoracic spine CT would be recommended.   Electronically Signed   By: Lovey Newcomer M.D.   On: 11/11/2014 08:54   Dg Lumbar Spine 2-3 Views  11/11/2014   CLINICAL DATA:  Alzheimer's, diabetes mellitus, fell 2 days ago in shower at home, LEFT hip pain  EXAM: LUMBAR SPINE - 2-3 VIEW  COMPARISON:  CT abdomen pelvis 08/13/2012  FINDINGS: Osseous demineralization.  Five non-rib-bearing lumbar vertebra.  Disc space narrowing and endplate spur formation at lower thoracic spine and T12-L1.  Vertebral body heights maintained.  No definite fracture, subluxation or bone destruction.  Facet degenerative changes lower lumbar spine.  Surgical clips RIGHT upper  quadrant.  Visualized portion of pelvis intact.  IMPRESSION: Osseous demineralization with degenerative disc disease changes of the lower thoracic spine and at the thoracolumbar junction.  Facet degenerative changes lower lumbar spine.  No acute abnormalities.   Electronically Signed   By: Lavonia Dana M.D.   On: 11/11/2014 08:58   Ct Head Wo Contrast  11/11/2014   CLINICAL DATA:  Initial evaluation for acute trauma, fall. History of meningioma.  EXAM: CT HEAD WITHOUT CONTRAST  CT CERVICAL SPINE WITHOUT CONTRAST  TECHNIQUE: Multidetector CT imaging of the head and cervical spine was performed following the standard protocol without intravenous contrast. Multiplanar CT image reconstructions of the cervical spine were also generated.  COMPARISON:  Prior CT from 10/08/2007.  FINDINGS: CT HEAD FINDINGS  Per history, patient has history of meningioma with involvement of the ethmoid sinus and previous resection of the left orbital roof. Evidence of prior left frontal craniotomy with underlying encephalomalacia.  Extensive mass involving the left ethmoidal air cells with superior extension into the left frontal sinus seen. The tumor now extends intracranial E with involvement of the anteromedial left frontal lobe. The tumor straddles the anterior falx with probable component within the medial right frontal lobe. There is greater lateral extension of the tumor and to the superior aspect of the bony left orbit with associated left-sided proptosis. There is further osseous erosion of the frontal calvarium, likely related to tumor expansion and/or reactive changes. Additionally, there is possible extension of the tumor inferiorly and laterally into the left middle cranial fossa at the medial and anterior aspect of the left temporal pole (series 9, image 9). It oblique, this may reflect a second meningioma. Exact measurements of this lesion fairly difficult due to its extensive nature and multi focal components. Lesion does  measure approximately 8 cm and craniocaudad dimension and 4.4 cm and transverse dimension.  There is a second apparent dural-based mass with scattered calcification involving the lateral left temporal lobe (series 9, image 10). This measures approximately 5.3 x 3.2 x 2.4 cm. There is underlying osseous erosion with extension through the aberrant table. Finding likely reflects an additional meningioma. It is unclear whether this is contiguous with the main mass or represents a separate lesion.  There is a predominantly cystic mass within the left occipital lobe that measures 2.8 x 1.8 x 2.5 cm (series 9, image 14). Localized vasogenic edema about this lesion. Finding concerning for possible second primary CNS neoplasm.  Extensive hypodensity throughout the left cerebral hemisphere, likely a combination of vasogenic edema and post radiation changes. There is 1 cm of left-to-right midline shift at the level of the septum pellucidum with partial effacement of the left lateral ventricle. Basilar cistern crowding present. No hydrocephalus.  No acute large vessel territory infarct. No intracranial hemorrhage. No extra-axial fluid collection.  Scalp soft tissues demonstrate no acute abnormality. Left-sided proptosis as above. No other acute abnormality about the orbits.  Left maxillary sinus and left sphenoid sinus are largely opacified, likely postobstructive related to the mass. Right-sided paranasal sinuses are clear. Left mastoid effusion present, likely chronic. Right mastoid air cells clear.  CT CERVICAL SPINE FINDINGS  Vertebral bodies are normally aligned with  preservation of the normal cervical lordosis. Vertebral body heights maintained. Normal C1-2 articulations preserved. No acute fracture or listhesis. Prevertebral soft tissues within normal limits.  Moderate degenerative spondylolysis present at C3-4 and C4-5. Additional more mild degenerative changes scattered throughout the cervical spine.  No acute soft  tissue abnormality within the neck. Vascular calcifications present about the low left carotid bifurcation.  Visualized lung apices are clear.  IMPRESSION: CT BRAIN:  1. No acute traumatic injury within the brain. 2. Marked interval enlargement of left ethmoidal sinus and anterior skullbase mass, with increased extension into the bony left orbit, with new intracranial extension into both the left and right frontal lobes. Possible extension into the left middle cranial fossa versus separate meningioma. Per history, patient has a known unresectable meningioma. 3. Second 5.3 x 3.2 x 2.4 cm dural based mass at the lateral left temporal lobe, suspicious for a second meningioma. This lesion may in fact be contiguous with the first meningioma within the anteromedial left middle cranial fossa, although this is not well delineated on this exam. 4. 2.8 x 1.8 x 2.5 cm predominately cystic mass in the left occipital lobe, indeterminate, but worrisome for possible second primary CNS neoplasm. Further evaluation with dedicated brain MRI, with and without contrast is recommended if not already performed. 5. Extensive hypodensity throughout the left cerebral hemisphere, likely combination of vasogenic edema and post radiation changes. There is associated 1 cm of left-to-right midline shift with partial effacement a left lateral ventricle and crowding of the basilar cisterns. CT CERVICAL SPINE:  1. No acute traumatic injury within the cervical spine. 2. Moderate degenerative spondylolysis at C3-4 and C4-5.   Electronically Signed   By: Jeannine Boga M.D.   On: 11/11/2014 00:21   Ct Cervical Spine Wo Contrast  11/11/2014   CLINICAL DATA:  Initial evaluation for acute trauma, fall. History of meningioma.  EXAM: CT HEAD WITHOUT CONTRAST  CT CERVICAL SPINE WITHOUT CONTRAST  TECHNIQUE: Multidetector CT imaging of the head and cervical spine was performed following the standard protocol without intravenous contrast. Multiplanar  CT image reconstructions of the cervical spine were also generated.  COMPARISON:  Prior CT from 10/08/2007.  FINDINGS: CT HEAD FINDINGS  Per history, patient has history of meningioma with involvement of the ethmoid sinus and previous resection of the left orbital roof. Evidence of prior left frontal craniotomy with underlying encephalomalacia.  Extensive mass involving the left ethmoidal air cells with superior extension into the left frontal sinus seen. The tumor now extends intracranial E with involvement of the anteromedial left frontal lobe. The tumor straddles the anterior falx with probable component within the medial right frontal lobe. There is greater lateral extension of the tumor and to the superior aspect of the bony left orbit with associated left-sided proptosis. There is further osseous erosion of the frontal calvarium, likely related to tumor expansion and/or reactive changes. Additionally, there is possible extension of the tumor inferiorly and laterally into the left middle cranial fossa at the medial and anterior aspect of the left temporal pole (series 9, image 9). It oblique, this may reflect a second meningioma. Exact measurements of this lesion fairly difficult due to its extensive nature and multi focal components. Lesion does measure approximately 8 cm and craniocaudad dimension and 4.4 cm and transverse dimension.  There is a second apparent dural-based mass with scattered calcification involving the lateral left temporal lobe (series 9, image 10). This measures approximately 5.3 x 3.2 x 2.4 cm. There is underlying osseous erosion  with extension through the aberrant table. Finding likely reflects an additional meningioma. It is unclear whether this is contiguous with the main mass or represents a separate lesion.  There is a predominantly cystic mass within the left occipital lobe that measures 2.8 x 1.8 x 2.5 cm (series 9, image 14). Localized vasogenic edema about this lesion. Finding  concerning for possible second primary CNS neoplasm.  Extensive hypodensity throughout the left cerebral hemisphere, likely a combination of vasogenic edema and post radiation changes. There is 1 cm of left-to-right midline shift at the level of the septum pellucidum with partial effacement of the left lateral ventricle. Basilar cistern crowding present. No hydrocephalus.  No acute large vessel territory infarct. No intracranial hemorrhage. No extra-axial fluid collection.  Scalp soft tissues demonstrate no acute abnormality. Left-sided proptosis as above. No other acute abnormality about the orbits.  Left maxillary sinus and left sphenoid sinus are largely opacified, likely postobstructive related to the mass. Right-sided paranasal sinuses are clear. Left mastoid effusion present, likely chronic. Right mastoid air cells clear.  CT CERVICAL SPINE FINDINGS  Vertebral bodies are normally aligned with preservation of the normal cervical lordosis. Vertebral body heights maintained. Normal C1-2 articulations preserved. No acute fracture or listhesis. Prevertebral soft tissues within normal limits.  Moderate degenerative spondylolysis present at C3-4 and C4-5. Additional more mild degenerative changes scattered throughout the cervical spine.  No acute soft tissue abnormality within the neck. Vascular calcifications present about the low left carotid bifurcation.  Visualized lung apices are clear.  IMPRESSION: CT BRAIN:  1. No acute traumatic injury within the brain. 2. Marked interval enlargement of left ethmoidal sinus and anterior skullbase mass, with increased extension into the bony left orbit, with new intracranial extension into both the left and right frontal lobes. Possible extension into the left middle cranial fossa versus separate meningioma. Per history, patient has a known unresectable meningioma. 3. Second 5.3 x 3.2 x 2.4 cm dural based mass at the lateral left temporal lobe, suspicious for a second  meningioma. This lesion may in fact be contiguous with the first meningioma within the anteromedial left middle cranial fossa, although this is not well delineated on this exam. 4. 2.8 x 1.8 x 2.5 cm predominately cystic mass in the left occipital lobe, indeterminate, but worrisome for possible second primary CNS neoplasm. Further evaluation with dedicated brain MRI, with and without contrast is recommended if not already performed. 5. Extensive hypodensity throughout the left cerebral hemisphere, likely combination of vasogenic edema and post radiation changes. There is associated 1 cm of left-to-right midline shift with partial effacement a left lateral ventricle and crowding of the basilar cisterns. CT CERVICAL SPINE:  1. No acute traumatic injury within the cervical spine. 2. Moderate degenerative spondylolysis at C3-4 and C4-5.   Electronically Signed   By: Jeannine Boga M.D.   On: 11/11/2014 00:21   Dg Hips Bilat With Pelvis 3-4 Views  11/11/2014   CLINICAL DATA:  Patient status post fall.  Initial encounter.  EXAM: DG HIP (WITH OR WITHOUT PELVIS) 3-4V BILAT  COMPARISON:  CT abdomen pelvis 08/13/2012  FINDINGS: Lower lumbar spine degenerative changes. Normal anatomic alignment. No definite evidence for acute fracture or dislocation although evaluation of the left hip is limited due to difficulty with patient positioning.  IMPRESSION: No definite displaced fracture although evaluation of the left hip is limited due to patient positioning. If there is high clinical suspicion for occult fracture or the patient refuses to weightbear, consider further evaluation with MRI.  Although CT is expeditious, evidence is lacking regarding accuracy of CT over plain film radiography.   Electronically Signed   By: Lovey Newcomer M.D.   On: 11/11/2014 09:00     Medical Consultants:    None.  Anti-Infectives:    None.  Subjective:   Dennie Maizes Crum reports a headache.  No other pain.  No dyspnea or cough.     Objective:    Filed Vitals:   11/12/14 0441 11/12/14 1445 11/12/14 2250 11/13/14 0429  BP: 158/78 129/60 141/60 172/84  Pulse: 74 73 66 73  Temp: 98.3 F (36.8 C) 98.5 F (36.9 C) 98.2 F (36.8 C) 98.1 F (36.7 C)  TempSrc: Oral Oral Oral Oral  Resp: 18 20 20 20   Height:      Weight: 97.2 kg (214 lb 4.6 oz)   96.6 kg (212 lb 15.4 oz)  SpO2: 100% 100% 100% 100%    Intake/Output Summary (Last 24 hours) at 11/13/14 0800 Last data filed at 11/13/14 0707  Gross per 24 hour  Intake 2496.25 ml  Output      0 ml  Net 2496.25 ml    Exam: Gen:  Awake Neuro: Disoriented to place and date, some garbled speech at times Cardiovascular:  RRR, No M/R/G Respiratory:  Lungs CTAB Gastrointestinal:  Abdomen soft, NT/ND, + BS Extremities:  No C/E/C, TEDS hose on   Data Reviewed:    Labs: Basic Metabolic Panel:  Recent Labs Lab 11/10/14 2221 11/11/14 0425  11/12/14 0956 11/13/14 0419  NA 141 137  --  141 139  K 3.3* 5.5*  < > 2.7* 3.4*  CL 105 104  --  113* 107  CO2 27 21*  --  23 24  GLUCOSE 102* 115*  --  111* 110*  BUN 9 10  --  9 9  CREATININE 0.84 0.86  --  0.63 0.72  CALCIUM 10.0 9.5  --  8.9 9.3  MG  --   --   --  1.9  --   < > = values in this interval not displayed. GFR Estimated Creatinine Clearance: 82.1 mL/min (by C-G formula based on Cr of 0.72). Liver Function Tests:  Recent Labs Lab 11/11/14 0425  AST 31  ALT 20  ALKPHOS 122  BILITOT 0.8  PROT 7.1  ALBUMIN 3.8   CBC:  Recent Labs Lab 11/10/14 2221 11/11/14 0425  WBC 8.9 7.7  NEUTROABS 6.7 5.4  HGB 15.8* 14.5  HCT 48.2* 45.9  MCV 86.1 86.9  PLT 222 234   Cardiac Enzymes:  Recent Labs Lab 11/10/14 2221 11/11/14 0425 11/12/14 0956  CKTOTAL 496* 635* 304*   CBG:  Recent Labs Lab 11/12/14 0123 11/12/14 0639 11/12/14 1142 11/12/14 1645 11/13/14 0734  GLUCAP 116* 67 100* 108* 98   Thyroid function studies:  Recent Labs  11/11/14 0425  TSH 1.390    Microbiology No  results found for this or any previous visit (from the past 240 hour(s)).   Medications:   . amLODipine  10 mg Oral Daily  . insulin aspart  0-9 Units Subcutaneous TID WC  . levETIRAcetam  750 mg Intravenous Q12H  . sodium chloride  3 mL Intravenous Q12H  . venlafaxine XR  150 mg Oral QHS   Continuous Infusions: . 0.9 % NaCl with KCl 40 mEq / L 75 mL/hr (11/13/14 0032)    Time spent: 25 minutes.   LOS: 2 days   Shakiera Edelson  [facility] Pager [phone]. If unable to reach me  by pager, please call my cell phone at [phone].  *Please refer to [email or web address], password [email or web address] to get updated schedule on who will round on this patient, as hospitalists switch teams weekly. If 7PM-7AM, please contact night-coverage at [email or web address], password [email or web address] for any overnight needs.  11/13/2014, 8:00 AM

## 2014-11-14 LAB — BASIC METABOLIC PANEL
Anion gap: 8 (ref 5–15)
BUN: 8 mg/dL (ref 6–20)
CALCIUM: 9.9 mg/dL (ref 8.9–10.3)
CO2: 24 mmol/L (ref 22–32)
CREATININE: 0.65 mg/dL (ref 0.44–1.00)
Chloride: 109 mmol/L (ref 101–111)
GFR calc Af Amer: >60 mL/min (ref >60)
GLUCOSE: 113 mg/dL — AB (ref 65–99)
Potassium: 4.2 mmol/L (ref 3.5–5.1)
SODIUM: 141 mmol/L (ref 135–145)

## 2014-11-14 LAB — LEVETIRACETAM LEVEL: LEVETIRACETAM: 14 ug/mL (ref 10.0–40.0)

## 2014-11-14 LAB — CK: Total CK: 177 U/L (ref 38–234)

## 2014-11-14 LAB — GLUCOSE, CAPILLARY: GLUCOSE-CAPILLARY: 76 mg/dL (ref 65–99)

## 2014-11-14 MED ORDER — LEVETIRACETAM 100 MG/ML PO SOLN
750.0000 mg | Freq: Two times a day (BID) | ORAL | Status: DC
  Administered 2014-11-14: 750 mg via ORAL
  Filled 2014-11-14: qty 7.5

## 2014-11-14 NOTE — Progress Notes (Signed)
Physical Therapy Treatment Patient Details Name: Nancy Bailey MRN: [id number] DOB: 02-06-1946 Today's Date: 11/14/2014    History of Present Illness 69 y.o. 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    PT Comments     pt progressing;   Follow Up Recommendations  SNF;Supervision/Assistance - 24 hour     Equipment Recommendations  Hospital bed;Wheelchair (measurements PT)    Recommendations for Other Services       Precautions / Restrictions Precautions Precautions: Fall Restrictions Weight Bearing Restrictions: No    Mobility  Bed Mobility Overal bed mobility: Needs Assistance;+2 for physical assistance Bed Mobility: Supine to Sit;Sit to Supine     Supine to sit: +2 for physical assistance;Mod assist;+2 for safety/equipment;HOB elevated Sit to supine: +2 for physical assistance;Mod assist   General bed mobility comments: pt requiring +2 for LEs, trunk and bed pad used to scoot pt; multi-modal cues for all aspects of task  Transfers Overall transfer level: Needs assistance Equipment used: 2 person hand held assist Transfers: Sit to/from [facility] Sit to Stand: Mod assist;+2 safety/equipment;+2 physical assistance Stand pivot transfers: +2 physical assistance;Mod assist       General transfer comment: multi-modal cues for sequence, completion of task  Ambulation/Gait                 Stairs            Wheelchair Mobility    Modified Rankin (Stroke Patients Only)       Balance Overall balance assessment: Needs assistance Sitting-balance support: Single extremity supported;Feet supported Sitting balance-Leahy Scale: Fair Sitting balance - Comments: intermittitent posterior lean, corrects with tactile cues     Standing balance-Leahy Scale: Fair                      Cognition   Behavior During  Therapy: Flat affect;Impulsive Overall Cognitive Status: No family/caregiver present to determine baseline cognitive functioning Area of Impairment: Orientation;Memory;Following commands;Safety/judgement;Problem solving Orientation Level: Disoriented to;Situation;Place   Memory: Decreased short-term memory Following Commands: Follows one step commands inconsistently Safety/Judgement: Decreased awareness of safety;Decreased awareness of deficits   Problem Solving: Slow processing;Decreased initiation;Difficulty sequencing;Requires verbal cues;Requires tactile cues General Comments: pt appears apraxic, requiring multi-modal cues for  basic tasks    Exercises      General Comments        Pertinent Vitals/Pain Pain Assessment: No/denies pain    Home Living                      Prior Function            PT Goals (current goals can now be found in the care plan section) Acute Rehab PT Goals Patient Stated Goal: did not state PT Goal Formulation: Patient unable to participate in goal setting Time For Goal Achievement: 11/25/14 Potential to Achieve Goals: Fair Progress towards PT goals: Progressing toward goals    Frequency  Min 3X/week    PT Plan Current plan remains appropriate    Co-evaluation             End of Session Equipment Utilized During Treatment: Gait belt Activity Tolerance: Patient tolerated treatment well Patient left: in bed;with call bell/phone within reach;with bed alarm set     Time: 6644-0347 PT Time Calculation (min) (ACUTE ONLY): 21 min  Charges:  $Therapeutic Activity: 8-22 mins  G CodesKenyon Ana 11/14/2014, 12:51 PM

## 2014-11-14 NOTE — Care Management Note (Signed)
Case Management Note  Patient Details  Name: Nancy Bailey MRN: [id number] Date of Birth: 14-Jul-1945  Subjective/Objective:                    Action/Plan:d/c SNF   Expected Discharge Date:                  Expected Discharge Plan:  Skilled Nursing Facility  In-House Referral:     Discharge planning Services  CM Consult  Post Acute Care Choice:    Choice offered to:     DME Arranged:    DME Agency:     HH Arranged:    [place] Agency:     Status of Service:  Completed, signed off  Medicare Important Message Given:    Date Medicare IM Given:    Medicare IM give by:    Date Additional Medicare IM Given:    Additional Medicare Important Message give by:     If discussed at [place] of Stay Meetings, dates discussed:    Additional Comments:  Dessa Phi, RN 11/14/2014, 11:25 AM

## 2014-11-14 NOTE — Discharge Summary (Signed)
Physician Discharge Summary  KRIMSON MASSMANN DTO:[id number] DOB: 1945-12-22 DOA: 11/10/2014  PCP: Jani Gravel, MD  Admit date: 11/10/2014 Discharge date: 11/14/2014   Recommendations for Outpatient Follow-Up:   1. Recommend avoidance of medications that prolong QTc interval. 2. Monitor blood glucoses closely, adjust home medications for any recurrent hypoglycemia.   Discharge Diagnosis:   Principal Problem:    Fall from hypoglycemia versus seizure resulting in acute encephalopathy Active Problems:    Meningioma, multiple    Seizure disorder    Hypokalemia    Essential hypertension    Diabetes mellitus, type 2    Prolonged Q-T interval on ECG    Dysphasia    Hypoglycemia    Acute encephalopathy    Rhabdomyolysis   Discharge disposition: SNF  Discharge Condition: Improved.  Diet recommendation: Low sodium, heart healthy.  Carbohydrate-modified.  Dysphagia II.  History of Present Illness:   Nancy Bailey is an 69 y.o. female the PMH of unresectable meningioma on close surveillance, Alzheimer's disease and diabetes who was admitted on 11/10/14 after suffering from an unwitnessed fall at home. She was incontinent of urine when found down, but was conscious and aware of her surroundings.  Hospital Course by Problem:   Principal Problem:  Fall in the setting of unresectable meningioma thought to be secondary to hypoglycemia versus seizure with resultant encephalopathy - Status post CT scan of the head which showed progression of disease. No intracranial bleeding. - The patient is on Keppra for seizure prophylaxis. EEG done 11/11/14, negative for epileptiform discharges. - Seen by PT 11/11/14 with recommendations for SNF versus 24 hour supervision at home. - Multiple x-rays as noted below, negative for fractures. U/A negative for nitrites/leukocytes. - PT/OT evaluations performed, SNF recommended.  Active Problems:  Mild oral dysphasia - Evaluated by speech therapy.  Dysphagia 2 diet recommended.   Prolonged QTc - QTc 569. ? Had an arrhythmia given electrolyte abnormalities. - Avoid medication that can further prolong QTc. - NSR on tele, no arrhythmic events.   Hypokalemia - Potassium WNL after replacement.   Mild rhabdomyolysis - CK levels WNL after hydration.   Meningioma, multiple - Progression of disease noted on CT scan. - Status post stereotactic radiation treatment back in 2013.   Acute encephalopathy - EEG findings consistent with encephalopathy. - Improved with mental status back to baseline at discharge.   Seizure disorder - Continue Keppra. Resume home dose at discharge.   Essential hypertension - Continue amlodipine and Cozaar.   Diabetes mellitus, type 2 / hypoglycemia - No further hypoglycemic episodes. CBGs 84-108 over the past 24 hours. - Currently on insulin sensitive SSI Q AC/HS. Resume home regimen at discharge.    Medical Consultants:    None.   Discharge Exam:   Filed Vitals:   11/14/14 0458  BP: 168/75  Pulse:   Temp:   Resp:    Filed Vitals:   11/13/14 1446 11/13/14 2035 11/14/14 0456 11/14/14 0458  BP: 128/69 167/85 171/69 168/75  Pulse: 63 67 75   Temp: 98.8 F (37.1 C) 98 F (36.7 C) 98.4 F (36.9 C)   TempSrc: Oral Oral Oral   Resp: 20 20 20    Height:      Weight:   96.616 kg (213 lb)   SpO2: 100% 99% 100%     Gen:  NAD, pleasantly confused but awake and alert Cardiovascular:  RRR, No M/R/G Respiratory: Lungs CTAB Gastrointestinal: Abdomen soft, NT/ND with normal active bowel sounds. Extremities: No C/E/C   The results  of significant diagnostics from this hospitalization (including imaging, microbiology, ancillary and laboratory) are listed below for reference.     Procedures and Diagnostic Studies:   Dg Thoracic Spine 2 View  11/11/2014   CLINICAL DATA:  Patient status post fall in shower 2 days prior. Initial encounter.  EXAM: THORACIC SPINE 2 VIEWS  COMPARISON:   Chest radiograph 10/12/2007  FINDINGS: Markedly limited examination due to difficulty with patient positioning. The visualized mid aspect of the thoracic spine demonstrates multilevel degenerative disc disease with anterior endplate osteophytosis. No definite displaced fracture involving the mid aspect of the thoracic spine however the entire thoracic spine is not able to be adequately visualized.  IMPRESSION: Markedly limited exam due to difficulty patient positioning. The mid aspect of the thoracic spine demonstrates multilevel degenerative changes without definite evidence for displaced fracture. Entire thoracic spine is not visualized and therefore not assessed. If there concern for thoracic spine injury/fracture, further evaluation with thoracic spine CT would be recommended.   Electronically Signed   By: Lovey Newcomer M.D.   On: 11/11/2014 08:54   Dg Lumbar Spine 2-3 Views  11/11/2014   CLINICAL DATA:  Alzheimer's, diabetes mellitus, fell 2 days ago in shower at home, LEFT hip pain  EXAM: LUMBAR SPINE - 2-3 VIEW  COMPARISON:  CT abdomen pelvis 08/13/2012  FINDINGS: Osseous demineralization.  Five non-rib-bearing lumbar vertebra.  Disc space narrowing and endplate spur formation at lower thoracic spine and T12-L1.  Vertebral body heights maintained.  No definite fracture, subluxation or bone destruction.  Facet degenerative changes lower lumbar spine.  Surgical clips RIGHT upper quadrant.  Visualized portion of pelvis intact.  IMPRESSION: Osseous demineralization with degenerative disc disease changes of the lower thoracic spine and at the thoracolumbar junction.  Facet degenerative changes lower lumbar spine.  No acute abnormalities.   Electronically Signed   By: Lavonia Dana M.D.   On: 11/11/2014 08:58   Ct Head Wo Contrast  11/11/2014   CLINICAL DATA:  Initial evaluation for acute trauma, fall. History of meningioma.  EXAM: CT HEAD WITHOUT CONTRAST  CT CERVICAL SPINE WITHOUT CONTRAST  TECHNIQUE:  Multidetector CT imaging of the head and cervical spine was performed following the standard protocol without intravenous contrast. Multiplanar CT image reconstructions of the cervical spine were also generated.  COMPARISON:  Prior CT from 10/08/2007.  FINDINGS: CT HEAD FINDINGS  Per history, patient has history of meningioma with involvement of the ethmoid sinus and previous resection of the left orbital roof. Evidence of prior left frontal craniotomy with underlying encephalomalacia.  Extensive mass involving the left ethmoidal air cells with superior extension into the left frontal sinus seen. The tumor now extends intracranial E with involvement of the anteromedial left frontal lobe. The tumor straddles the anterior falx with probable component within the medial right frontal lobe. There is greater lateral extension of the tumor and to the superior aspect of the bony left orbit with associated left-sided proptosis. There is further osseous erosion of the frontal calvarium, likely related to tumor expansion and/or reactive changes. Additionally, there is possible extension of the tumor inferiorly and laterally into the left middle cranial fossa at the medial and anterior aspect of the left temporal pole (series 9, image 9). It oblique, this may reflect a second meningioma. Exact measurements of this lesion fairly difficult due to its extensive nature and multi focal components. Lesion does measure approximately 8 cm and craniocaudad dimension and 4.4 cm and transverse dimension.  There is a second apparent dural-based  mass with scattered calcification involving the lateral left temporal lobe (series 9, image 10). This measures approximately 5.3 x 3.2 x 2.4 cm. There is underlying osseous erosion with extension through the aberrant table. Finding likely reflects an additional meningioma. It is unclear whether this is contiguous with the main mass or represents a separate lesion.  There is a predominantly cystic  mass within the left occipital lobe that measures 2.8 x 1.8 x 2.5 cm (series 9, image 14). Localized vasogenic edema about this lesion. Finding concerning for possible second primary CNS neoplasm.  Extensive hypodensity throughout the left cerebral hemisphere, likely a combination of vasogenic edema and post radiation changes. There is 1 cm of left-to-right midline shift at the level of the septum pellucidum with partial effacement of the left lateral ventricle. Basilar cistern crowding present. No hydrocephalus.  No acute large vessel territory infarct. No intracranial hemorrhage. No extra-axial fluid collection.  Scalp soft tissues demonstrate no acute abnormality. Left-sided proptosis as above. No other acute abnormality about the orbits.  Left maxillary sinus and left sphenoid sinus are largely opacified, likely postobstructive related to the mass. Right-sided paranasal sinuses are clear. Left mastoid effusion present, likely chronic. Right mastoid air cells clear.  CT CERVICAL SPINE FINDINGS  Vertebral bodies are normally aligned with preservation of the normal cervical lordosis. Vertebral body heights maintained. Normal C1-2 articulations preserved. No acute fracture or listhesis. Prevertebral soft tissues within normal limits.  Moderate degenerative spondylolysis present at C3-4 and C4-5. Additional more mild degenerative changes scattered throughout the cervical spine.  No acute soft tissue abnormality within the neck. Vascular calcifications present about the low left carotid bifurcation.  Visualized lung apices are clear.  IMPRESSION: CT BRAIN:  1. No acute traumatic injury within the brain. 2. Marked interval enlargement of left ethmoidal sinus and anterior skullbase mass, with increased extension into the bony left orbit, with new intracranial extension into both the left and right frontal lobes. Possible extension into the left middle cranial fossa versus separate meningioma. Per history, patient has a  known unresectable meningioma. 3. Second 5.3 x 3.2 x 2.4 cm dural based mass at the lateral left temporal lobe, suspicious for a second meningioma. This lesion may in fact be contiguous with the first meningioma within the anteromedial left middle cranial fossa, although this is not well delineated on this exam. 4. 2.8 x 1.8 x 2.5 cm predominately cystic mass in the left occipital lobe, indeterminate, but worrisome for possible second primary CNS neoplasm. Further evaluation with dedicated brain MRI, with and without contrast is recommended if not already performed. 5. Extensive hypodensity throughout the left cerebral hemisphere, likely combination of vasogenic edema and post radiation changes. There is associated 1 cm of left-to-right midline shift with partial effacement a left lateral ventricle and crowding of the basilar cisterns. CT CERVICAL SPINE:  1. No acute traumatic injury within the cervical spine. 2. Moderate degenerative spondylolysis at C3-4 and C4-5.   Electronically Signed   By: Jeannine Boga M.D.   On: 11/11/2014 00:21   Ct Cervical Spine Wo Contrast  11/11/2014   CLINICAL DATA:  Initial evaluation for acute trauma, fall. History of meningioma.  EXAM: CT HEAD WITHOUT CONTRAST  CT CERVICAL SPINE WITHOUT CONTRAST  TECHNIQUE: Multidetector CT imaging of the head and cervical spine was performed following the standard protocol without intravenous contrast. Multiplanar CT image reconstructions of the cervical spine were also generated.  COMPARISON:  Prior CT from 10/08/2007.  FINDINGS: CT HEAD FINDINGS  Per history, patient  has history of meningioma with involvement of the ethmoid sinus and previous resection of the left orbital roof. Evidence of prior left frontal craniotomy with underlying encephalomalacia.  Extensive mass involving the left ethmoidal air cells with superior extension into the left frontal sinus seen. The tumor now extends intracranial E with involvement of the anteromedial  left frontal lobe. The tumor straddles the anterior falx with probable component within the medial right frontal lobe. There is greater lateral extension of the tumor and to the superior aspect of the bony left orbit with associated left-sided proptosis. There is further osseous erosion of the frontal calvarium, likely related to tumor expansion and/or reactive changes. Additionally, there is possible extension of the tumor inferiorly and laterally into the left middle cranial fossa at the medial and anterior aspect of the left temporal pole (series 9, image 9). It oblique, this may reflect a second meningioma. Exact measurements of this lesion fairly difficult due to its extensive nature and multi focal components. Lesion does measure approximately 8 cm and craniocaudad dimension and 4.4 cm and transverse dimension.  There is a second apparent dural-based mass with scattered calcification involving the lateral left temporal lobe (series 9, image 10). This measures approximately 5.3 x 3.2 x 2.4 cm. There is underlying osseous erosion with extension through the aberrant table. Finding likely reflects an additional meningioma. It is unclear whether this is contiguous with the main mass or represents a separate lesion.  There is a predominantly cystic mass within the left occipital lobe that measures 2.8 x 1.8 x 2.5 cm (series 9, image 14). Localized vasogenic edema about this lesion. Finding concerning for possible second primary CNS neoplasm.  Extensive hypodensity throughout the left cerebral hemisphere, likely a combination of vasogenic edema and post radiation changes. There is 1 cm of left-to-right midline shift at the level of the septum pellucidum with partial effacement of the left lateral ventricle. Basilar cistern crowding present. No hydrocephalus.  No acute large vessel territory infarct. No intracranial hemorrhage. No extra-axial fluid collection.  Scalp soft tissues demonstrate no acute abnormality.  Left-sided proptosis as above. No other acute abnormality about the orbits.  Left maxillary sinus and left sphenoid sinus are largely opacified, likely postobstructive related to the mass. Right-sided paranasal sinuses are clear. Left mastoid effusion present, likely chronic. Right mastoid air cells clear.  CT CERVICAL SPINE FINDINGS  Vertebral bodies are normally aligned with preservation of the normal cervical lordosis. Vertebral body heights maintained. Normal C1-2 articulations preserved. No acute fracture or listhesis. Prevertebral soft tissues within normal limits.  Moderate degenerative spondylolysis present at C3-4 and C4-5. Additional more mild degenerative changes scattered throughout the cervical spine.  No acute soft tissue abnormality within the neck. Vascular calcifications present about the low left carotid bifurcation.  Visualized lung apices are clear.  IMPRESSION: CT BRAIN:  1. No acute traumatic injury within the brain. 2. Marked interval enlargement of left ethmoidal sinus and anterior skullbase mass, with increased extension into the bony left orbit, with new intracranial extension into both the left and right frontal lobes. Possible extension into the left middle cranial fossa versus separate meningioma. Per history, patient has a known unresectable meningioma. 3. Second 5.3 x 3.2 x 2.4 cm dural based mass at the lateral left temporal lobe, suspicious for a second meningioma. This lesion may in fact be contiguous with the first meningioma within the anteromedial left middle cranial fossa, although this is not well delineated on this exam. 4. 2.8 x 1.8 x 2.5 cm  predominately cystic mass in the left occipital lobe, indeterminate, but worrisome for possible second primary CNS neoplasm. Further evaluation with dedicated brain MRI, with and without contrast is recommended if not already performed. 5. Extensive hypodensity throughout the left cerebral hemisphere, likely combination of vasogenic edema  and post radiation changes. There is associated 1 cm of left-to-right midline shift with partial effacement a left lateral ventricle and crowding of the basilar cisterns. CT CERVICAL SPINE:  1. No acute traumatic injury within the cervical spine. 2. Moderate degenerative spondylolysis at C3-4 and C4-5.   Electronically Signed   By: Jeannine Boga M.D.   On: 11/11/2014 00:21   Dg Hips Bilat With Pelvis 3-4 Views  11/11/2014   CLINICAL DATA:  Patient status post fall.  Initial encounter.  EXAM: DG HIP (WITH OR WITHOUT PELVIS) 3-4V BILAT  COMPARISON:  CT abdomen pelvis 08/13/2012  FINDINGS: Lower lumbar spine degenerative changes. Normal anatomic alignment. No definite evidence for acute fracture or dislocation although evaluation of the left hip is limited due to difficulty with patient positioning.  IMPRESSION: No definite displaced fracture although evaluation of the left hip is limited due to patient positioning. If there is high clinical suspicion for occult fracture or the patient refuses to weightbear, consider further evaluation with MRI. Although CT is expeditious, evidence is lacking regarding accuracy of CT over plain film radiography.   Electronically Signed   By: Lovey Newcomer M.D.   On: 11/11/2014 09:00   EEG 11/11/14  Impression: This awake and asleep EEG is abnormal due to the presence of: 1. Mild diffuse slowing of the background 2. Additional focal slowing over the left hemisphere, maximal over the left frontotemporal region  Clinical Correlation of the above findings indicates bilateral cerebral dysfunction that is non-specific in etiology and can be seen with hypoxic/ischemic injury, toxic/metabolic encephalopathies, or medication effect. Additional focal slowing over the left hemisphere indicates focal cerebral dysfunction in this region, suggestive of underlying structural or physiologic abnormality. The absence of epileptiform discharges does not rule out a clinical diagnosis of  epilepsy. Clinical correlation is advised.  Labs:   Basic Metabolic Panel:  Recent Labs Lab 11/10/14 2221 11/11/14 0425  11/12/14 0956 11/13/14 0419 11/14/14 0500  NA 141 137  --  141 139 141  K 3.3* 5.5*  < > 2.7* 3.4* 4.2  CL 105 104  --  113* 107 109  CO2 27 21*  --  23 24 24   GLUCOSE 102* 115*  --  111* 110* 113*  BUN 9 10  --  9 9 8   CREATININE 0.84 0.86  --  0.63 0.72 0.65  CALCIUM 10.0 9.5  --  8.9 9.3 9.9  MG  --   --   --  1.9  --   --   < > = values in this interval not displayed. GFR Estimated Creatinine Clearance: 82.1 mL/min (by C-G formula based on Cr of 0.65). Liver Function Tests:  Recent Labs Lab 11/11/14 0425  AST 31  ALT 20  ALKPHOS 122  BILITOT 0.8  PROT 7.1  ALBUMIN 3.8   CBC:  Recent Labs Lab 11/10/14 2221 11/11/14 0425  WBC 8.9 7.7  NEUTROABS 6.7 5.4  HGB 15.8* 14.5  HCT 48.2* 45.9  MCV 86.1 86.9  PLT 222 234   Cardiac Enzymes:  Recent Labs Lab 11/10/14 2221 11/11/14 0425 11/12/14 0956 11/14/14 0500  CKTOTAL 496* 635* 304* 177   CBG:  Recent Labs Lab 11/13/14 0734 11/13/14 1212 11/13/14 1631 11/13/14  2034 11/14/14 0749  GLUCAP 98 101* 84 90 76     Discharge Instructions:   Discharge Instructions    Call MD for:  extreme fatigue    Complete by:  As directed      Call MD for:    Complete by:  As directed   Seizures, somnolence     Diet - low sodium heart healthy    Complete by:  As directed      Diet Carb Modified    Complete by:  As directed      Increase activity slowly    Complete by:  As directed      Walk with assistance    Complete by:  As directed      Walker     Complete by:  As directed             Medication List    TAKE these medications        amLODipine 10 MG tablet  Commonly known as:  NORVASC  Take 10 mg by mouth daily.     levETIRAcetam 750 MG tablet  Commonly known as:  KEPPRA  Take 1,500 mg by mouth at bedtime.     losartan 100 MG tablet  Commonly known as:  COZAAR    Take 100 mg by mouth daily.     RESTASIS 0.05 % ophthalmic emulsion  Generic drug:  cycloSPORINE  Place 1 drop into both eyes daily as needed.     sitaGLIPtin 100 MG tablet  Commonly known as:  JANUVIA  Take 100 mg by mouth daily.     tamsulosin 0.4 MG Caps capsule  Commonly known as:  FLOMAX  Take 0.4 mg by mouth daily.     venlafaxine XR 150 MG 24 hr capsule  Commonly known as:  EFFEXOR-XR  Take 150 mg by mouth at bedtime.          Time coordinating discharge: 35 minutes.  Signed:  Porcha Deblanc  Pager [phone] [facility] 11/14/2014, 8:33 AM

## 2014-11-14 NOTE — Progress Notes (Signed)
Report called to [facility]. Received by Carmell Austria, RN at [phone].

## 2014-11-14 NOTE — Clinical Social Work Placement (Signed)
Patient is set to discharge to [facility] today. Patient & son, [name] aware. Discharge packet given to RN, Casey. [facility] called for transport.     Raynaldo Opitz, [facility] Clinical Social Worker cell #: [phone]    CLINICAL SOCIAL WORK PLACEMENT  NOTE  Date:  11/14/2014  Patient Details  Name: Nancy Bailey MRN: [id number] Date of Birth: 1945/06/06  Clinical Social Work is seeking post-discharge placement for this patient at the [place] level of care (*CSW will initial, date and re-position this form in  chart as items are completed):  Yes   Patient/family provided with [place] Work Department's list of facilities offering this level of care within the geographic area requested by the patient (or if unable, by the patient's family).  Yes   Patient/family informed of their freedom to choose among providers that offer the needed level of care, that participate in Medicare, Medicaid or managed care program needed by the patient, have an available bed and are willing to accept the patient.  Yes   Patient/family informed of [place]'s ownership interest in [facility] and [facility], as well as of the fact that they are under no obligation to receive care at these facilities.  PASRR submitted to EDS on 11/11/14     PASRR number received on 11/11/14     Existing PASRR number confirmed on       FL2 transmitted to all facilities in geographic area requested by pt/family on 11/11/14     FL2 transmitted to all facilities within larger geographic area on       Patient informed that his/her managed care company has contracts with or will negotiate with certain facilities, including the following:        Yes   Patient/family informed of bed offers received.  Patient chooses bed at [facility] recommends and patient chooses bed at      Patient to be transferred to [facility] on 11/14/14.  Patient to be transferred to facility by [facility]     Patient family notified on 11/14/14 of transfer.  Name of family member notified:  patient's son, [name] via phone     PHYSICIAN       Additional Comment:    _______________________________________________ Standley Brooking, LCSW 11/14/2014, 10:58 AM

## 2014-11-15 ENCOUNTER — Encounter: Payer: Self-pay | Admitting: Internal Medicine

## 2014-11-15 ENCOUNTER — Non-Acute Institutional Stay (SKILLED_NURSING_FACILITY): Payer: PRIVATE HEALTH INSURANCE | Admitting: Internal Medicine

## 2014-11-15 DIAGNOSIS — D429 Neoplasm of uncertain behavior of meninges, unspecified: Secondary | ICD-10-CM | POA: Diagnosis not present

## 2014-11-15 DIAGNOSIS — E11649 Type 2 diabetes mellitus with hypoglycemia without coma: Secondary | ICD-10-CM

## 2014-11-15 DIAGNOSIS — I4581 Long QT syndrome: Secondary | ICD-10-CM | POA: Diagnosis not present

## 2014-11-15 DIAGNOSIS — G9340 Encephalopathy, unspecified: Secondary | ICD-10-CM

## 2014-11-15 DIAGNOSIS — I10 Essential (primary) hypertension: Secondary | ICD-10-CM | POA: Diagnosis not present

## 2014-11-15 DIAGNOSIS — R4702 Dysphasia: Secondary | ICD-10-CM | POA: Diagnosis not present

## 2014-11-15 DIAGNOSIS — G40909 Epilepsy, unspecified, not intractable, without status epilepticus: Secondary | ICD-10-CM

## 2014-11-15 DIAGNOSIS — E876 Hypokalemia: Secondary | ICD-10-CM | POA: Diagnosis not present

## 2014-11-15 DIAGNOSIS — R9431 Abnormal electrocardiogram [ECG] [EKG]: Secondary | ICD-10-CM

## 2014-11-15 NOTE — Progress Notes (Signed)
MRN: [id number] Name: PETRONELLA SHUFORD  Sex: female Age: 69 y.o. DOB: March 24, 1945  [place] #: [facility] Facility/Room:112 Level Of Care: SNF Provider: Inocencio Homes D Emergency Contacts: Extended Emergency Contact Information Primary Emergency Contact: Storm Frisk [address] [place] Phone: [phone] Relation: Son Secondary Emergency Contact: Tina Griffiths [place] Phone: [phone] Relation: Son  Code Status:   Allergies: Codeine; Penicillins; and Sulfa antibiotics  Chief Complaint  Patient presents with  . New Admit To SNF    HPI: Patient is 69 y.o. female with PMH of unresectable meningioma on close surveillance, Alzheimer's disease and diabetes who was admitted on 11/10/14 after suffering from an unwitnessed fall at home. She was incontinent of urine when found down, but was conscious and aware of her surroundings. Pt was admitted to hospital form 9/1-5 hwere seizure was ruled out but meningioma had progressed. Hospitalization complicated by hypoglycemia but didn't recur. It was decided pt needed 24 hr supervision so pt is admitted to SNF. While at SNF pt will be followed for seizure prophylaxis, tx with Keppra, HTN, tx with norvasc and cozaar and DM2 being tx with SSI only.  Past Medical History  Diagnosis Date  . Cancer     Brain?  . Alzheimer disease   . Diabetes mellitus without complication   . Smoker     History reviewed. No pertinent past surgical history.    Medication List       This list is accurate as of: 11/15/14 11:59 PM.  Always use your most recent med list.               amLODipine 10 MG tablet  Commonly known as:  NORVASC  Take 10 mg by mouth daily.     levETIRAcetam 750 MG tablet  Commonly known as:  KEPPRA  Take 1,500 mg by mouth at bedtime.     losartan 100 MG tablet  Commonly known as:  COZAAR  Take 100 mg by mouth daily.     RESTASIS 0.05 % ophthalmic emulsion  Generic drug:   cycloSPORINE  Place 1 drop into both eyes daily as needed.     sitaGLIPtin 100 MG tablet  Commonly known as:  JANUVIA  Take 100 mg by mouth daily.     tamsulosin 0.4 MG Caps capsule  Commonly known as:  FLOMAX  Take 0.4 mg by mouth daily.     venlafaxine XR 150 MG 24 hr capsule  Commonly known as:  EFFEXOR-XR  Take 150 mg by mouth at bedtime.        No orders of the defined types were placed in this encounter.     There is no immunization history on file for this patient.  Social History  Substance Use Topics  . Smoking status: Current Every Day Smoker -- 1.00 packs/day    Types: Cigarettes  . Smokeless tobacco: Never Used  . Alcohol Use: No    Family history is unobtainable. There is none in EPIC and pt says she doesn't know.   Review of Systems  DATA OBTAINED: from patient, nurse GENERAL:  no fevers, no new fatigue or appetite changes SKIN: No itching, rash  EYES: No eye pain, redness, discharge EARS: No earache, tinnitus, change in hearing NOSE: No congestion, drainage or bleeding  MOUTH/THROAT: No mouth or tooth pain, No sore throat RESPIRATORY: No cough, wheezing, SOB CARDIAC: No chest pain, palpitations, lower extremity edema  GI: No abdominal  pain, No N/V/D or constipation, No heartburn or reflux  GU: No dysuria, frequency or urgency, or incontinence  MUSCULOSKELETAL: No unrelieved bone/joint pain NEUROLOGIC: No headache, dizziness or focal weakness PSYCHIATRIC: No c/o anxiety or sadness   Filed Vitals:   11/15/14 2011  BP: 137/79  Pulse: 68  Temp: 98.1 F (36.7 C)  Resp: 18    SpO2 Readings from Last 1 Encounters:  11/14/14 100%        Physical Exam  GENERAL APPEARANCE: Alert, conversant, BF, No acute distress.  SKIN: No diaphoresis rash HEAD: Normocephalic, atraumatic  EYES: Conjunctiva/lids clear. Pupils round, reactive; L orbit protrudng EARS: External exam WNL, canals clear. Hearing grossly normal.  NOSE: No deformity or discharge.   MOUTH/THROAT: Lips w/o lesions  RESPIRATORY: Breathing is even, unlabored. Lung sounds are clear   CARDIOVASCULAR: Heart RRR no murmurs, rubs or gallops. No peripheral edema.   GASTROINTESTINAL: Abdomen is soft, non-tender, not distended w/ normal bowel sounds. GENITOURINARY: Bladder non tender, not distended  MUSCULOSKELETAL: No abnormal joints or musculature NEUROLOGIC:  Cranial nerves 2-12 grossly intact. Moves all extremities  PSYCHIATRIC: Mood and affect appropriate to situation, no behavioral issues  Patient Active Problem List   Diagnosis Date Noted  . Dysphasia 11/13/2014  . Hypoglycemia 11/13/2014  . Acute encephalopathy 11/13/2014  . Rhabdomyolysis 11/13/2014  . Prolonged Q-T interval on ECG 11/12/2014  . Fall 11/11/2014  . Seizure disorder 11/11/2014  . Hypokalemia 11/11/2014  . Essential hypertension 11/11/2014  . Diabetes mellitus, type 2 11/11/2014  . Meningioma, multiple 04/10/2011    CBC    Component Value Date/Time   WBC 7.7 11/11/2014 0425   WBC 6.1 04/17/2011 0919   RBC 5.28* 11/11/2014 0425   RBC 4.73 04/17/2011 0919   HGB 14.5 11/11/2014 0425   HGB 12.6 04/17/2011 0919   HCT 45.9 11/11/2014 0425   HCT 38.4 04/17/2011 0919   PLT 234 11/11/2014 0425   PLT 284 04/17/2011 0919   MCV 86.9 11/11/2014 0425   MCV 81.3 04/17/2011 0919   LYMPHSABS 1.7 11/11/2014 0425   LYMPHSABS 2.1 04/17/2011 0919   MONOABS 0.6 11/11/2014 0425   MONOABS 0.3 04/17/2011 0919   EOSABS 0.0 11/11/2014 0425   EOSABS 0.0 04/17/2011 0919   BASOSABS 0.0 11/11/2014 0425   BASOSABS 0.0 04/17/2011 0919    CMP     Component Value Date/Time   NA 141 11/14/2014 0500   K 4.2 11/14/2014 0500   CL 109 11/14/2014 0500   CO2 24 11/14/2014 0500   GLUCOSE 113* 11/14/2014 0500   BUN 8 11/14/2014 0500   CREATININE 0.65 11/14/2014 0500   CALCIUM 9.9 11/14/2014 0500   PROT 7.1 11/11/2014 0425   ALBUMIN 3.8 11/11/2014 0425   AST 31 11/11/2014 0425   ALT 20 11/11/2014 0425   ALKPHOS  122 11/11/2014 0425   BILITOT 0.8 11/11/2014 0425   GFRNONAA >60 11/14/2014 0500   GFRAA >60 11/14/2014 0500    No results found for: HGBA1C   Dg Thoracic Spine 2 View  11/11/2014   CLINICAL DATA:  Patient status post fall in shower 2 days prior. Initial encounter.  EXAM: THORACIC SPINE 2 VIEWS  COMPARISON:  Chest radiograph 10/12/2007  FINDINGS: Markedly limited examination due to difficulty with patient positioning. The visualized mid aspect of the thoracic spine demonstrates multilevel degenerative disc disease with anterior endplate osteophytosis. No definite displaced fracture involving the mid aspect of the thoracic spine however the entire thoracic spine is not able to be adequately visualized.  IMPRESSION: Markedly limited exam due to difficulty patient positioning. The mid aspect of the thoracic spine demonstrates multilevel degenerative changes without definite evidence for displaced fracture. Entire thoracic spine is not visualized and therefore not assessed. If there concern for thoracic spine injury/fracture, further evaluation with thoracic spine CT would be recommended.   Electronically Signed   By: Lovey Newcomer M.D.   On: 11/11/2014 08:54   Dg Lumbar Spine 2-3 Views  11/11/2014   CLINICAL DATA:  Alzheimer's, diabetes mellitus, fell 2 days ago in shower at home, LEFT hip pain  EXAM: LUMBAR SPINE - 2-3 VIEW  COMPARISON:  CT abdomen pelvis 08/13/2012  FINDINGS: Osseous demineralization.  Five non-rib-bearing lumbar vertebra.  Disc space narrowing and endplate spur formation at lower thoracic spine and T12-L1.  Vertebral body heights maintained.  No definite fracture, subluxation or bone destruction.  Facet degenerative changes lower lumbar spine.  Surgical clips RIGHT upper quadrant.  Visualized portion of pelvis intact.  IMPRESSION: Osseous demineralization with degenerative disc disease changes of the lower thoracic spine and at the thoracolumbar junction.  Facet degenerative changes lower  lumbar spine.  No acute abnormalities.   Electronically Signed   By: Lavonia Dana M.D.   On: 11/11/2014 08:58   Dg Hips Bilat With Pelvis 3-4 Views  11/11/2014   CLINICAL DATA:  Patient status post fall.  Initial encounter.  EXAM: DG HIP (WITH OR WITHOUT PELVIS) 3-4V BILAT  COMPARISON:  CT abdomen pelvis 08/13/2012  FINDINGS: Lower lumbar spine degenerative changes. Normal anatomic alignment. No definite evidence for acute fracture or dislocation although evaluation of the left hip is limited due to difficulty with patient positioning.  IMPRESSION: No definite displaced fracture although evaluation of the left hip is limited due to patient positioning. If there is high clinical suspicion for occult fracture or the patient refuses to weightbear, consider further evaluation with MRI. Although CT is expeditious, evidence is lacking regarding accuracy of CT over plain film radiography.   Electronically Signed   By: Lovey Newcomer M.D.   On: 11/11/2014 09:00    Not all labs, radiology exams or other studies done during hospitalization come through on my EPIC note; however they are reviewed by me.    Assessment and Plan  No problem-specific assessment & plan notes found for this encounter.  Time spent > 45 min; > 50% of time with patient was spent reviewing records, labs, tests and studies, counseling and developing plan of care  Hennie Duos, MD

## 2014-11-17 NOTE — Assessment & Plan Note (Addendum)
Status post CT scan of the head which showed progression of disease. No intracranial bleeding.Status post stereotactic radiation treatment back in 2013.The patient is on Keppra for seizure prophylaxis. EEG done 11/11/14, negative for epileptiform discharges. - Seen by PT 11/11/14 with recommendations for SNF versus 24 hour supervision at home. - Multiple x-rays as noted below, negative for fractures. U/A negative for nitrites/leukocytes SNF - OT/PT, 24 hour supervision

## 2014-11-17 NOTE — Assessment & Plan Note (Signed)
SNF - dysphagia 2 diet

## 2014-11-17 NOTE — Assessment & Plan Note (Signed)
Repleted; SNF - will recheck BMP

## 2014-11-17 NOTE — Assessment & Plan Note (Signed)
SNF - cont Norvasc and cozaar

## 2014-11-17 NOTE — Assessment & Plan Note (Signed)
SNF- cont Keppra 1500 mg q HS

## 2014-11-17 NOTE — Assessment & Plan Note (Signed)
EEG findings consistent with encephalopathy

## 2014-11-17 NOTE — Assessment & Plan Note (Signed)
SNF- SSI only, may be able to drop that for pt comfort

## 2014-11-17 NOTE — Assessment & Plan Note (Signed)
QTc 569. ? Had an arrhythmia given electrolyte abnormalities. - Avoid medication that can further prolong QTc. - NSR on tele, no arrhythmic event; SNF - list antipsychotics as caution since highly likely to require meds like these in near future

## 2014-11-18 ENCOUNTER — Encounter (HOSPITAL_COMMUNITY): Payer: Self-pay | Admitting: *Deleted

## 2014-11-18 ENCOUNTER — Emergency Department (HOSPITAL_COMMUNITY): Payer: PRIVATE HEALTH INSURANCE

## 2014-11-18 ENCOUNTER — Emergency Department (HOSPITAL_COMMUNITY)
Admission: EM | Admit: 2014-11-18 | Discharge: 2014-11-18 | Disposition: A | Discharge status: Home / Self Care | Payer: PRIVATE HEALTH INSURANCE | Attending: Emergency Medicine | Admitting: Emergency Medicine

## 2014-11-18 DIAGNOSIS — Z720 Tobacco use: Secondary | ICD-10-CM | POA: Diagnosis not present

## 2014-11-18 DIAGNOSIS — F0280 Dementia in other diseases classified elsewhere without behavioral disturbance: Secondary | ICD-10-CM | POA: Diagnosis not present

## 2014-11-18 DIAGNOSIS — G309 Alzheimer's disease, unspecified: Secondary | ICD-10-CM | POA: Diagnosis not present

## 2014-11-18 DIAGNOSIS — Z880 Allergy status to penicillin: Secondary | ICD-10-CM | POA: Insufficient documentation

## 2014-11-18 DIAGNOSIS — Y998 Other external cause status: Secondary | ICD-10-CM | POA: Insufficient documentation

## 2014-11-18 DIAGNOSIS — Z79899 Other long term (current) drug therapy: Secondary | ICD-10-CM | POA: Insufficient documentation

## 2014-11-18 DIAGNOSIS — E119 Type 2 diabetes mellitus without complications: Secondary | ICD-10-CM | POA: Insufficient documentation

## 2014-11-18 DIAGNOSIS — Z85841 Personal history of malignant neoplasm of brain: Secondary | ICD-10-CM | POA: Insufficient documentation

## 2014-11-18 DIAGNOSIS — Y9389 Activity, other specified: Secondary | ICD-10-CM | POA: Insufficient documentation

## 2014-11-18 DIAGNOSIS — W01198A Fall on same level from slipping, tripping and stumbling with subsequent striking against other object, initial encounter: Secondary | ICD-10-CM | POA: Insufficient documentation

## 2014-11-18 DIAGNOSIS — Y9289 Other specified places as the place of occurrence of the external cause: Secondary | ICD-10-CM | POA: Insufficient documentation

## 2014-11-18 DIAGNOSIS — W19XXXA Unspecified fall, initial encounter: Secondary | ICD-10-CM

## 2014-11-18 DIAGNOSIS — S0990XA Unspecified injury of head, initial encounter: Secondary | ICD-10-CM | POA: Diagnosis not present

## 2014-11-18 LAB — CBC WITH DIFFERENTIAL/PLATELET
Basophils Absolute: 0 10*3/uL (ref 0.0–0.1)
Basophils Relative: 0 % (ref 0–1)
Eosinophils Absolute: 0 10*3/uL (ref 0.0–0.7)
Eosinophils Relative: 0 % (ref 0–5)
HEMATOCRIT: 48 % — AB (ref 36.0–46.0)
HEMOGLOBIN: 15.6 g/dL — AB (ref 12.0–15.0)
LYMPHS ABS: 1.3 10*3/uL (ref 0.7–4.0)
Lymphocytes Relative: 22 % (ref 12–46)
MCH: 28.4 pg (ref 26.0–34.0)
MCHC: 32.5 g/dL (ref 30.0–36.0)
MCV: 87.3 fL (ref 78.0–100.0)
MONOS PCT: 9 % (ref 3–12)
Monocytes Absolute: 0.6 10*3/uL (ref 0.1–1.0)
NEUTROS ABS: 4.1 10*3/uL (ref 1.7–7.7)
NEUTROS PCT: 69 % (ref 43–77)
Platelets: 233 10*3/uL (ref 150–400)
RBC: 5.5 MIL/uL — ABNORMAL HIGH (ref 3.87–5.11)
RDW: 14.5 % (ref 11.5–15.5)
WBC: 6 10*3/uL (ref 4.0–10.5)

## 2014-11-18 LAB — BASIC METABOLIC PANEL
Anion gap: 8 (ref 5–15)
BUN: 18 mg/dL (ref 6–20)
CHLORIDE: 106 mmol/L (ref 101–111)
CO2: 25 mmol/L (ref 22–32)
CREATININE: 0.85 mg/dL (ref 0.44–1.00)
Calcium: 10.2 mg/dL (ref 8.9–10.3)
GFR calc non Af Amer: >60 mL/min (ref >60)
GLUCOSE: 93 mg/dL (ref 65–99)
Potassium: 4 mmol/L (ref 3.5–5.1)
Sodium: 139 mmol/L (ref 135–145)

## 2014-11-18 NOTE — ED Notes (Signed)
Delay in lab draw, pt not in room 

## 2014-11-18 NOTE — ED Notes (Signed)
Report  Called to [place] at [facility] [phone] Pt will be returning to their facility - [facility] has been notified

## 2014-11-18 NOTE — ED Notes (Signed)
MD at bedside. Dr. Rancour 

## 2014-11-18 NOTE — ED Notes (Signed)
Bed: [facility] Expected date:  Expected time:  Means of arrival:  Comments: 

## 2014-11-18 NOTE — ED Notes (Signed)
Patient refused to ambulate 

## 2014-11-18 NOTE — ED Notes (Signed)
Bed: [facility]14 Expected date:  Expected time:  Means of arrival:  Comments: Ems-  

## 2014-11-18 NOTE — ED Notes (Signed)
Pt was at skilled nursing facility, fell around 8 am.  Pt is not on blood thinners.  This was a witnessed fall; however, SNF facility provided no details as to what part of her head she hit.  There was no LOC.  Pt has hx of dementia.

## 2014-11-18 NOTE — Discharge Instructions (Signed)

## 2014-11-18 NOTE — ED Notes (Signed)
Patient had a witnessed fall today at the nursing facility. Patient hit her head, but is not on blood thinners and did not lose consciousness. Patient has history of dementia and behavioral issues according to staff. No deformities noted.

## 2014-11-18 NOTE — ED Provider Notes (Signed)
CSN: [id number]     Arrival date & time 11/18/14  6568 History   First MD Initiated Contact with Patient 11/18/14 1017     Chief Complaint  Patient presents with  . Fall     (Consider location/radiation/quality/duration/timing/severity/associated sxs/prior Treatment) HPI Comments: Level 5 caveat for dementia. Patient from nursing home after witnessed fall. She did hit her head. She did not lose consciousness by report. Acting baseline per family members and nursing home staff. She is oriented 2. She is moving all her extremities. Patient with a history of meningioma that is unresectable. No reported fever. Last chemotherapy in October 2015. Last radiation 2013. No vomiting, fever, chills. No coughing. She is unable to give any details about the fall or what is hurting her.  The history is provided by the patient, the EMS personnel and a friend. The history is limited by the condition of the patient.    Past Medical History  Diagnosis Date  . Cancer     Brain?  . Alzheimer disease   . Diabetes mellitus without complication   . Smoker    History reviewed. No pertinent past surgical history. No family history on file. Social History  Substance Use Topics  . Smoking status: Current Every Day Smoker -- 1.00 packs/day    Types: Cigarettes  . Smokeless tobacco: Never Used  . Alcohol Use: No   OB History    No data available     Review of Systems  Unable to perform ROS: Dementia      Allergies  Morphine and related; Penicillins; Codeine; Haldol; and Sulfa antibiotics  Home Medications   Prior to Admission medications   Medication Sig Start Date End Date Taking? Authorizing Provider  amLODipine (NORVASC) 10 MG tablet Take 10 mg by mouth daily.   Yes Historical Provider, MD  bisacodyl (DULCOLAX) 10 MG suppository Place 10 mg rectally daily as needed for moderate constipation or severe constipation.   Yes Historical Provider, MD  cycloSPORINE (RESTASIS) 0.05 % ophthalmic  emulsion Place 1 drop into both eyes daily as needed (dry eyes).    Yes Historical Provider, MD  levETIRAcetam (KEPPRA) 750 MG tablet Take 1,500 mg by mouth at bedtime.    Yes Historical Provider, MD  losartan (COZAAR) 100 MG tablet Take 100 mg by mouth daily.   Yes Historical Provider, MD  magnesium hydroxide (MILK OF MAGNESIA) 400 MG/5ML suspension Take 30 mLs by mouth daily as needed for mild constipation or moderate constipation.   Yes Historical Provider, MD  sitaGLIPtin (JANUVIA) 100 MG tablet Take 100 mg by mouth daily.   Yes Historical Provider, MD  Sodium Phosphates (RA SALINE ENEMA RE) Place 1 application rectally daily as needed (constipation if not relieved by suppository).   Yes Historical Provider, MD  tamsulosin (FLOMAX) 0.4 MG CAPS capsule Take 0.4 mg by mouth daily.   Yes Historical Provider, MD  venlafaxine XR (EFFEXOR-XR) 150 MG 24 hr capsule Take 150 mg by mouth at bedtime.   Yes Historical Provider, MD   BP 161/80 mmHg  Pulse 81  Temp(Src) 97.4 F (36.3 C) (Oral)  Resp 17  SpO2 100% Physical Exam  Constitutional: She appears well-developed and well-nourished. No distress.  HENT:  Head: Normocephalic and atraumatic.  Mouth/Throat: Oropharynx is clear and moist. No oropharyngeal exudate.  Eyes: Conjunctivae are normal. Pupils are equal, round, and reactive to light.  L eye exophthalmos.  Baseline per family  Neck: Normal range of motion.  Diffuse paraspinal C spine tenderness  Cardiovascular: Normal  rate, regular rhythm and normal heart sounds.   No murmur heard. Pulmonary/Chest: Effort normal and breath sounds normal. No respiratory distress. She exhibits no tenderness.  Abdominal: Soft. There is no tenderness. There is no rebound and no guarding.  Musculoskeletal: Normal range of motion. She exhibits no edema or tenderness.  FROM hips without pain  Neurological: She is alert.  Oriented to person and place. Moving all extremities, gait not tested.  Skin: She is not  diaphoretic.    ED Course  Procedures (including critical care time) Labs Review Labs Reviewed  CBC WITH DIFFERENTIAL/PLATELET - Abnormal; Notable for the following:    RBC 5.50 (*)    Hemoglobin 15.6 (*)    HCT 48.0 (*)    All other components within normal limits  BASIC METABOLIC PANEL  URINALYSIS, ROUTINE W REFLEX MICROSCOPIC (NOT AT [facility])    Imaging Review Dg Chest 2 View  11/18/2014   CLINICAL DATA:  Status post fall today.  EXAM: CHEST  2 VIEW  COMPARISON:  Single view of the chest 10/12/2007.  FINDINGS: There is cardiomegaly with out edema. Mild elevation of the right hemidiaphragm relative to the left is noted. The lungs are clear. No pneumothorax or pleural effusion. No focal bony abnormality. Surgical clips left upper quadrant of the abdomen are noted.  IMPRESSION: Cardiomegaly without acute disease.   Electronically Signed   By: Inge Rise M.D.   On: 11/18/2014 11:33   Dg Pelvis 1-2 Views  11/18/2014   CLINICAL DATA:  Fall.  Dementia  EXAM: PELVIS - 1-2 VIEW  FINDINGS: Image quality limited due to large patient size and overlying soft tissues  Slight irregularity left femoral neck may be due to projection. Nondisplaced fracture not excluded. If the patient has left hip pain, further evaluation with dedicated x-rays is suggested.  Right hip is normal. No other fractures. Mild SI degenerative change bilaterally  IMPRESSION: Mild irregularity of the left femoral neck likely due to projection. If the patient has left hip pain, further evaluation suggested as above.   Electronically Signed   By: Franchot Gallo M.D.   On: 11/18/2014 11:43   Ct Head Wo Contrast  11/18/2014   CLINICAL DATA:  69 year old female who fell at 0800 hours, on blood thinners. Initial encounter. Current history of unresectable meningioma, dementia.  EXAM: CT HEAD WITHOUT CONTRAST  CT CERVICAL SPINE WITHOUT CONTRAST  TECHNIQUE: Multidetector CT imaging of the head and cervical spine was performed following the  standard protocol without intravenous contrast. Multiplanar CT image reconstructions of the cervical spine were also generated.  COMPARISON:  Head and cervical spine CT 11/10/2014. Head CT without contrast 10/08/2007.  FINDINGS: CT HEAD FINDINGS  Bulky heterogeneously high density tumor tracks from the left nasal cavity, left maxillary antrum cephalad through the ethmoids, left orbit, and Intracranially into the left anterior cranial fossa, the left middle cranial fossa, and anterior interhemispheric fissure. Nodular hyperdense tumor is also suspected along the left lateral cerebral convexity, though less well defined. Size and configuration of tumor not changed since 11/10/2014.  Widespread vasogenic edema pattern white matter hypodensity in the left hemisphere associated with near complete effacement of the left lateral ventricle and rightward midline shift of 10 mm is stable. The left occipital horn may be trapped, accounting for the asymmetric more fluid density area in the posterior left hemisphere on series 2, image 14, unchanged. No right lateral ventriculomegaly. Stable basilar cisterns. Partially empty sella.  No superimposed acute intracranial hemorrhage or acute cortically based infarct  identified.  Tumor infiltration and destruction of the left skullbase and medial left facial bones also appears stable. Left exophthalmos and mass effect on the left intraorbital contents is stable. Left mastoid effusion and paranasal sinus aeration is stable. Previous left frontotemporal craniotomy changes. No new osseous abnormality. Stable scalp soft tissues.  CT CERVICAL SPINE FINDINGS  Stable cervical vertebral height and alignment. Visualized skull base is intact. No atlanto-occipital dissociation. Cervicothoracic junction alignment is within normal limits. Bilateral posterior element alignment is within normal limits. Stable cervical spine degeneration,  Moderate spinal stenosis at C3-C4 is related to ventral  hyperdense material favored to be disc herniation. There is also knee mild to moderate spinal stenosis at C4-C5 related to disc and endplate degeneration. These and other degenerative cervical spine changes appear stable. No acute cervical spine fracture identified. Grossly intact visualized upper thoracic levels.  Negative lung apices. No superior mediastinal lymphadenopathy. Tortuous proximal great vessels. Partially retropharyngeal course of the left carotid. Otherwise negative noncontrast paraspinal soft tissues.  IMPRESSION: 1. No new intracranial abnormality. 2. No acute fracture or listhesis identified in the cervical spine. Ligamentous injury is not excluded. 3. Sequelae of bulky left side extracranial trans-spatial, and intracranial tumor associated with skull destruction, left hemisphere edema, and intracranial mass effect with 10 mm of rightward midline shift appears stable since 11/10/2014. 4. Degenerative appearing up to moderate cervical spinal stenosis at C3-C4 and C4-C5 appears stable.   Electronically Signed   By: Genevie Ann M.D.   On: 11/18/2014 12:12   Ct Cervical Spine Wo Contrast  11/18/2014   CLINICAL DATA:  69 year old female who fell at 0800 hours, on blood thinners. Initial encounter. Current history of unresectable meningioma, dementia.  EXAM: CT HEAD WITHOUT CONTRAST  CT CERVICAL SPINE WITHOUT CONTRAST  TECHNIQUE: Multidetector CT imaging of the head and cervical spine was performed following the standard protocol without intravenous contrast. Multiplanar CT image reconstructions of the cervical spine were also generated.  COMPARISON:  Head and cervical spine CT 11/10/2014. Head CT without contrast 10/08/2007.  FINDINGS: CT HEAD FINDINGS  Bulky heterogeneously high density tumor tracks from the left nasal cavity, left maxillary antrum cephalad through the ethmoids, left orbit, and Intracranially into the left anterior cranial fossa, the left middle cranial fossa, and anterior  interhemispheric fissure. Nodular hyperdense tumor is also suspected along the left lateral cerebral convexity, though less well defined. Size and configuration of tumor not changed since 11/10/2014.  Widespread vasogenic edema pattern white matter hypodensity in the left hemisphere associated with near complete effacement of the left lateral ventricle and rightward midline shift of 10 mm is stable. The left occipital horn may be trapped, accounting for the asymmetric more fluid density area in the posterior left hemisphere on series 2, image 14, unchanged. No right lateral ventriculomegaly. Stable basilar cisterns. Partially empty sella.  No superimposed acute intracranial hemorrhage or acute cortically based infarct identified.  Tumor infiltration and destruction of the left skullbase and medial left facial bones also appears stable. Left exophthalmos and mass effect on the left intraorbital contents is stable. Left mastoid effusion and paranasal sinus aeration is stable. Previous left frontotemporal craniotomy changes. No new osseous abnormality. Stable scalp soft tissues.  CT CERVICAL SPINE FINDINGS  Stable cervical vertebral height and alignment. Visualized skull base is intact. No atlanto-occipital dissociation. Cervicothoracic junction alignment is within normal limits. Bilateral posterior element alignment is within normal limits. Stable cervical spine degeneration,  Moderate spinal stenosis at C3-C4 is related to ventral hyperdense material favored to  be disc herniation. There is also knee mild to moderate spinal stenosis at C4-C5 related to disc and endplate degeneration. These and other degenerative cervical spine changes appear stable. No acute cervical spine fracture identified. Grossly intact visualized upper thoracic levels.  Negative lung apices. No superior mediastinal lymphadenopathy. Tortuous proximal great vessels. Partially retropharyngeal course of the left carotid. Otherwise negative  noncontrast paraspinal soft tissues.  IMPRESSION: 1. No new intracranial abnormality. 2. No acute fracture or listhesis identified in the cervical spine. Ligamentous injury is not excluded. 3. Sequelae of bulky left side extracranial trans-spatial, and intracranial tumor associated with skull destruction, left hemisphere edema, and intracranial mass effect with 10 mm of rightward midline shift appears stable since 11/10/2014. 4. Degenerative appearing up to moderate cervical spinal stenosis at C3-C4 and C4-C5 appears stable.   Electronically Signed   By: Genevie Ann M.D.   On: 11/18/2014 12:12   Dg Hip Unilat With Pelvis 2-3 Views Left  11/18/2014   CLINICAL DATA:  Fall.  EXAM: DG HIP (WITH OR WITHOUT PELVIS) 2-3V LEFT  COMPARISON:  Pelvis 11/18/2014  FINDINGS: Current studies are better quality than the prior pelvis. No displaced fracture is identified. The left hip joint is normal. No regional pelvic fracture.  IMPRESSION: Negative   Electronically Signed   By: Franchot Gallo M.D.   On: 11/18/2014 13:33   I have personally reviewed and evaluated these images and lab results as part of my medical decision-making.   EKG Interpretation None      MDM   Final diagnoses:  None   From nursing home after unwitnessed fall. Confusion and dementia at baseline. Moving all extremities. Not on anticoagulation.  CT without acute traumatic pathology. CXR and pelvis Xray negative.  CT head results discussed with Dr. Ronnald Ramp. He agrees this appears to be chronic mass is indeed operative by neurosurgery in the past. No neurosurgery notes aren't available and care everywhere but family states that the mass was nonoperative by report. Midline shift appears similar to previous MRI in August.  Sons at bedside feel she is at her baseline mental status.  They are really not clear regarding her treatment at [facility].  Patient able to ambulate with assistance which is her baseline.  She appears stable for return to her nursing  facility.  ED ECG REPORT   Date: 11/18/2014  Rate: 84  Rhythm: normal sinus rhythm  QRS Axis: normal  Intervals: normal  ST/T Wave abnormalities: nonspecific ST/T changes  Conduction Disutrbances:none  Narrative Interpretation:   Old EKG Reviewed: none available  I have personally reviewed the EKG tracing and agree with the computerized printout as noted.     Ezequiel Essex, MD 11/18/14 2209

## 2014-12-09 ENCOUNTER — Non-Acute Institutional Stay (SKILLED_NURSING_FACILITY): Payer: PRIVATE HEALTH INSURANCE | Admitting: Nurse Practitioner

## 2014-12-09 DIAGNOSIS — F329 Major depressive disorder, single episode, unspecified: Secondary | ICD-10-CM

## 2014-12-09 DIAGNOSIS — E11649 Type 2 diabetes mellitus with hypoglycemia without coma: Secondary | ICD-10-CM | POA: Diagnosis not present

## 2014-12-09 DIAGNOSIS — G40909 Epilepsy, unspecified, not intractable, without status epilepticus: Secondary | ICD-10-CM

## 2014-12-09 DIAGNOSIS — R9431 Abnormal electrocardiogram [ECG] [EKG]: Secondary | ICD-10-CM

## 2014-12-09 DIAGNOSIS — I10 Essential (primary) hypertension: Secondary | ICD-10-CM

## 2014-12-09 DIAGNOSIS — D429 Neoplasm of uncertain behavior of meninges, unspecified: Secondary | ICD-10-CM | POA: Diagnosis not present

## 2014-12-09 DIAGNOSIS — I4581 Long QT syndrome: Secondary | ICD-10-CM

## 2014-12-09 DIAGNOSIS — F32A Depression, unspecified: Secondary | ICD-10-CM

## 2014-12-09 NOTE — Progress Notes (Signed)
Patient ID: Nancy Bailey, female   DOB: March 04, 1946, 69 y.o.   MRN: [id number]    Nursing Home Location:  [place] of Service: SNF (31)  PCP: Jani Gravel, MD  Allergies  Allergen Reactions  . Morphine And Related Hives and Shortness Of Breath  . Penicillins Hives, Shortness Of Breath and Other (See Comments)    H r Did PCN reaction occurring within the last 10 year If all of the above answers are "NO", then may proceed with Cephalosporin use. On MAR- unable to obtain answers to questions  . Codeine Other (See Comments)    hallucinations  . Haldol [Haloperidol]     All anti-psychotics-prolonged QT  . Sulfa Antibiotics Other (See Comments)    headache    Chief Complaint  Patient presents with  . Discharge Note    HPI:  Patient is a 69 y.o. female seen today at [facility] for discharge to [place]. Pt with a hx of unresectable meningioma on close surveillance, Alzheimer's disease, HTN, depression and diabetes. Pt was admitted to hospital form 9/1-9/5  After fall at home. Seizure was ruled out but meningioma had progressed. Hospitalization complicated by hypoglycemia but didn't recur. It was decided pt needed 24 hr supervision so pt is admitted to SNF. Pt had another fall while at SNF and was transferred to ED for evaluation which was negative. Pt unable to live alone pt will be moving to [place]. Patient currently doing well with therapy, now stable to discharge with home health. Review of Systems:  Review of Systems  Constitutional: Negative for activity change, appetite change, fatigue and unexpected weight change.  HENT: Negative for congestion and hearing loss.   Eyes: Positive for visual disturbance (ongoing).  Respiratory: Negative for cough and shortness of breath.   Cardiovascular: Negative for chest pain, palpitations and leg swelling.  Gastrointestinal: Negative for abdominal pain, diarrhea and constipation.  Genitourinary: Negative for  dysuria and difficulty urinating.  Musculoskeletal: Negative for myalgias and arthralgias.  Skin: Negative for color change and wound.  Neurological: Negative for dizziness and weakness.  Psychiatric/Behavioral: Positive for confusion. Negative for behavioral problems and agitation.       Memory loss    Past Medical History  Diagnosis Date  . Cancer     Brain?  . Alzheimer disease   . Diabetes mellitus without complication   . Smoker    No past surgical history on file. Social History:   reports that she has been smoking Cigarettes.  She has been smoking about 1.00 pack per day. She has never used smokeless tobacco. She reports that she does not drink alcohol or use illicit drugs.  No family history on file.  Medications: Patient's Medications  New Prescriptions   No medications on file  Previous Medications   AMLODIPINE (NORVASC) 10 MG TABLET    Take 10 mg by mouth daily.   BISACODYL (DULCOLAX) 10 MG SUPPOSITORY    Place 10 mg rectally daily as needed for moderate constipation or severe constipation.   CYCLOSPORINE (RESTASIS) 0.05 % OPHTHALMIC EMULSION    Place 1 drop into both eyes daily as needed (dry eyes).    LEVETIRACETAM (KEPPRA) 750 MG TABLET    Take 1,500 mg by mouth at bedtime.    LOSARTAN (COZAAR) 100 MG TABLET    Take 100 mg by mouth daily.   MAGNESIUM HYDROXIDE (MILK OF MAGNESIA) 400 MG/5ML SUSPENSION    Take 30 mLs by mouth daily as needed  for mild constipation or moderate constipation.   SITAGLIPTIN (JANUVIA) 100 MG TABLET    Take 100 mg by mouth daily.   SODIUM PHOSPHATES (RA SALINE ENEMA RE)    Place 1 application rectally daily as needed (constipation if not relieved by suppository).   TAMSULOSIN (FLOMAX) 0.4 MG CAPS CAPSULE    Take 0.4 mg by mouth daily.   VENLAFAXINE XR (EFFEXOR-XR) 150 MG 24 HR CAPSULE    Take 150 mg by mouth at bedtime.  Modified Medications   No medications on file  Discontinued Medications   No medications on file     Physical  Exam: Filed Vitals:   12/09/14 1422  BP: 145/80  Pulse: 77  Temp: 98 F (36.7 C)  Resp: 20    Physical Exam  Constitutional: She appears well-developed and well-nourished. No distress.  HENT:  Head: Normocephalic and atraumatic.  Mouth/Throat: Oropharynx is clear and moist. No oropharyngeal exudate.  Eyes: Conjunctivae are normal. Pupils are equal, round, and reactive to light.   Baseline L eye exophthalmos  Neck: Normal range of motion. Neck supple.  Cardiovascular: Normal rate, regular rhythm and normal heart sounds.   Pulmonary/Chest: Effort normal and breath sounds normal.  Abdominal: Soft. Bowel sounds are normal.  Musculoskeletal: She exhibits no edema or tenderness.  Neurological: She is alert.  Skin: Skin is warm and dry. She is not diaphoretic.  Psychiatric: She has a normal mood and affect.    Labs reviewed: Basic Metabolic Panel:  Recent Labs  11/12/14 0956 11/13/14 0419 11/14/14 0500 11/18/14 1145  NA 141 139 141 139  K 2.7* 3.4* 4.2 4.0  CL 113* 107 109 106  CO2 23 24 24 25   GLUCOSE 111* 110* 113* 93  BUN 9 9 8 18   CREATININE 0.63 0.72 0.65 0.85  CALCIUM 8.9 9.3 9.9 10.2  MG 1.9  --   --   --    Liver Function Tests:  Recent Labs  11/11/14 0425  AST 31  ALT 20  ALKPHOS 122  BILITOT 0.8  PROT 7.1  ALBUMIN 3.8   No results for input(s): LIPASE, AMYLASE in the last 8760 hours. No results for input(s): AMMONIA in the last 8760 hours. CBC:  Recent Labs  11/10/14 2221 11/11/14 0425 11/18/14 1145  WBC 8.9 7.7 6.0  NEUTROABS 6.7 5.4 4.1  HGB 15.8* 14.5 15.6*  HCT 48.2* 45.9 48.0*  MCV 86.1 86.9 87.3  PLT 222 234 233   TSH:  Recent Labs  11/11/14 0425  TSH 1.390   A1C: No results found for: HGBA1C Lipid Panel: No results for input(s): CHOL, HDL, LDLCALC, TRIG, CHOLHDL, LDLDIRECT in the last 8760 hours.  Radiological Exams: Dg Chest 2 View  11/18/2014   CLINICAL DATA:  Status post fall today.  EXAM: CHEST  2 VIEW  COMPARISON:   Single view of the chest 10/12/2007.  FINDINGS: There is cardiomegaly with out edema. Mild elevation of the right hemidiaphragm relative to the left is noted. The lungs are clear. No pneumothorax or pleural effusion. No focal bony abnormality. Surgical clips left upper quadrant of the abdomen are noted.  IMPRESSION: Cardiomegaly without acute disease.   Electronically Signed   By: Inge Rise M.D.   On: 11/18/2014 11:33   Dg Pelvis 1-2 Views  11/18/2014   CLINICAL DATA:  Fall.  Dementia  EXAM: PELVIS - 1-2 VIEW  FINDINGS: Image quality limited due to large patient size and overlying soft tissues  Slight irregularity left femoral neck may be due  to projection. Nondisplaced fracture not excluded. If the patient has left hip pain, further evaluation with dedicated x-rays is suggested.  Right hip is normal. No other fractures. Mild SI degenerative change bilaterally  IMPRESSION: Mild irregularity of the left femoral neck likely due to projection. If the patient has left hip pain, further evaluation suggested as above.   Electronically Signed   By: Franchot Gallo M.D.   On: 11/18/2014 11:43   Ct Head Wo Contrast  11/18/2014   CLINICAL DATA:  69 year old female who fell at 0800 hours, on blood thinners. Initial encounter. Current history of unresectable meningioma, dementia.  EXAM: CT HEAD WITHOUT CONTRAST  CT CERVICAL SPINE WITHOUT CONTRAST  TECHNIQUE: Multidetector CT imaging of the head and cervical spine was performed following the standard protocol without intravenous contrast. Multiplanar CT image reconstructions of the cervical spine were also generated.  COMPARISON:  Head and cervical spine CT 11/10/2014. Head CT without contrast 10/08/2007.  FINDINGS: CT HEAD FINDINGS  Bulky heterogeneously high density tumor tracks from the left nasal cavity, left maxillary antrum cephalad through the ethmoids, left orbit, and Intracranially into the left anterior cranial fossa, the left middle cranial fossa, and  anterior interhemispheric fissure. Nodular hyperdense tumor is also suspected along the left lateral cerebral convexity, though less well defined. Size and configuration of tumor not changed since 11/10/2014.  Widespread vasogenic edema pattern white matter hypodensity in the left hemisphere associated with near complete effacement of the left lateral ventricle and rightward midline shift of 10 mm is stable. The left occipital horn may be trapped, accounting for the asymmetric more fluid density area in the posterior left hemisphere on series 2, image 14, unchanged. No right lateral ventriculomegaly. Stable basilar cisterns. Partially empty sella.  No superimposed acute intracranial hemorrhage or acute cortically based infarct identified.  Tumor infiltration and destruction of the left skullbase and medial left facial bones also appears stable. Left exophthalmos and mass effect on the left intraorbital contents is stable. Left mastoid effusion and paranasal sinus aeration is stable. Previous left frontotemporal craniotomy changes. No new osseous abnormality. Stable scalp soft tissues.  CT CERVICAL SPINE FINDINGS  Stable cervical vertebral height and alignment. Visualized skull base is intact. No atlanto-occipital dissociation. Cervicothoracic junction alignment is within normal limits. Bilateral posterior element alignment is within normal limits. Stable cervical spine degeneration,  Moderate spinal stenosis at C3-C4 is related to ventral hyperdense material favored to be disc herniation. There is also knee mild to moderate spinal stenosis at C4-C5 related to disc and endplate degeneration. These and other degenerative cervical spine changes appear stable. No acute cervical spine fracture identified. Grossly intact visualized upper thoracic levels.  Negative lung apices. No superior mediastinal lymphadenopathy. Tortuous proximal great vessels. Partially retropharyngeal course of the left carotid. Otherwise negative  noncontrast paraspinal soft tissues.  IMPRESSION: 1. No new intracranial abnormality. 2. No acute fracture or listhesis identified in the cervical spine. Ligamentous injury is not excluded. 3. Sequelae of bulky left side extracranial trans-spatial, and intracranial tumor associated with skull destruction, left hemisphere edema, and intracranial mass effect with 10 mm of rightward midline shift appears stable since 11/10/2014. 4. Degenerative appearing up to moderate cervical spinal stenosis at C3-C4 and C4-C5 appears stable.   Electronically Signed   By: Genevie Ann M.D.   On: 11/18/2014 12:12   Ct Cervical Spine Wo Contrast  11/18/2014   CLINICAL DATA:  69 year old female who fell at 0800 hours, on blood thinners. Initial encounter. Current history of unresectable meningioma, dementia.  EXAM: CT HEAD WITHOUT CONTRAST  CT CERVICAL SPINE WITHOUT CONTRAST  TECHNIQUE: Multidetector CT imaging of the head and cervical spine was performed following the standard protocol without intravenous contrast. Multiplanar CT image reconstructions of the cervical spine were also generated.  COMPARISON:  Head and cervical spine CT 11/10/2014. Head CT without contrast 10/08/2007.  FINDINGS: CT HEAD FINDINGS  Bulky heterogeneously high density tumor tracks from the left nasal cavity, left maxillary antrum cephalad through the ethmoids, left orbit, and Intracranially into the left anterior cranial fossa, the left middle cranial fossa, and anterior interhemispheric fissure. Nodular hyperdense tumor is also suspected along the left lateral cerebral convexity, though less well defined. Size and configuration of tumor not changed since 11/10/2014.  Widespread vasogenic edema pattern white matter hypodensity in the left hemisphere associated with near complete effacement of the left lateral ventricle and rightward midline shift of 10 mm is stable. The left occipital horn may be trapped, accounting for the asymmetric more fluid density area in  the posterior left hemisphere on series 2, image 14, unchanged. No right lateral ventriculomegaly. Stable basilar cisterns. Partially empty sella.  No superimposed acute intracranial hemorrhage or acute cortically based infarct identified.  Tumor infiltration and destruction of the left skullbase and medial left facial bones also appears stable. Left exophthalmos and mass effect on the left intraorbital contents is stable. Left mastoid effusion and paranasal sinus aeration is stable. Previous left frontotemporal craniotomy changes. No new osseous abnormality. Stable scalp soft tissues.  CT CERVICAL SPINE FINDINGS  Stable cervical vertebral height and alignment. Visualized skull base is intact. No atlanto-occipital dissociation. Cervicothoracic junction alignment is within normal limits. Bilateral posterior element alignment is within normal limits. Stable cervical spine degeneration,  Moderate spinal stenosis at C3-C4 is related to ventral hyperdense material favored to be disc herniation. There is also knee mild to moderate spinal stenosis at C4-C5 related to disc and endplate degeneration. These and other degenerative cervical spine changes appear stable. No acute cervical spine fracture identified. Grossly intact visualized upper thoracic levels.  Negative lung apices. No superior mediastinal lymphadenopathy. Tortuous proximal great vessels. Partially retropharyngeal course of the left carotid. Otherwise negative noncontrast paraspinal soft tissues.  IMPRESSION: 1. No new intracranial abnormality. 2. No acute fracture or listhesis identified in the cervical spine. Ligamentous injury is not excluded. 3. Sequelae of bulky left side extracranial trans-spatial, and intracranial tumor associated with skull destruction, left hemisphere edema, and intracranial mass effect with 10 mm of rightward midline shift appears stable since 11/10/2014. 4. Degenerative appearing up to moderate cervical spinal stenosis at C3-C4 and  C4-C5 appears stable.   Electronically Signed   By: Genevie Ann M.D.   On: 11/18/2014 12:12   Dg Hip Unilat With Pelvis 2-3 Views Left  11/18/2014   CLINICAL DATA:  Fall.  EXAM: DG HIP (WITH OR WITHOUT PELVIS) 2-3V LEFT  COMPARISON:  Pelvis 11/18/2014  FINDINGS: Current studies are better quality than the prior pelvis. No displaced fracture is identified. The left hip joint is normal. No regional pelvic fracture.  IMPRESSION: Negative   Electronically Signed   By: Franchot Gallo M.D.   On: 11/18/2014 13:33    Assessment/Plan 1. Meningioma, multiple -ongoing disease progression. Status post stereotactic radiation treatment back in 2013.The patient is on Keppra for seizure prophylaxis. EEG done 11/11/14, negative for epileptiform discharges. -ongoing follow up by neurologist   2. Prolonged Q-T interval on ECG Noted during hospitalization. Had an arrhythmia given electrolyte abnormalities. Advised to avoid medication that can  further prolong QTc.  3. Seizure disorder Without acute seizures during SNF stay, conts on keppra 1500 mg qhs  4. Type 2 diabetes mellitus with hypoglycemia without coma -no further hypoglycemia noted, conts on januvia  5. Essential hypertension Blood pressure stable, conts on norvasc and coazaar  6. Depression Stable on effexor, to continue Effexor XR 150 mg daily    pt is stable for discharge-will need PT/OT per home health. No DME needed. Rx written.  will need to follow up with PCP within 2 weeks.   Carlos American. Harle Battiest  [facility] [phone] 8 am - 5 pm) [phone] (after hours)

## 2015-03-12 DEATH — deceased

## 2016-02-24 ENCOUNTER — Other Ambulatory Visit: Payer: Self-pay | Admitting: Nurse Practitioner

## 2017-06-11 IMAGING — CR DG PELVIS 1-2V
2 series · 2 of 2 positions shown · non-contrast
Comparison: none

CLINICAL DATA: Fall.  Dementia

EXAM:
PELVIS - 1-2 VIEW

[x pelvis (1 of 2)]
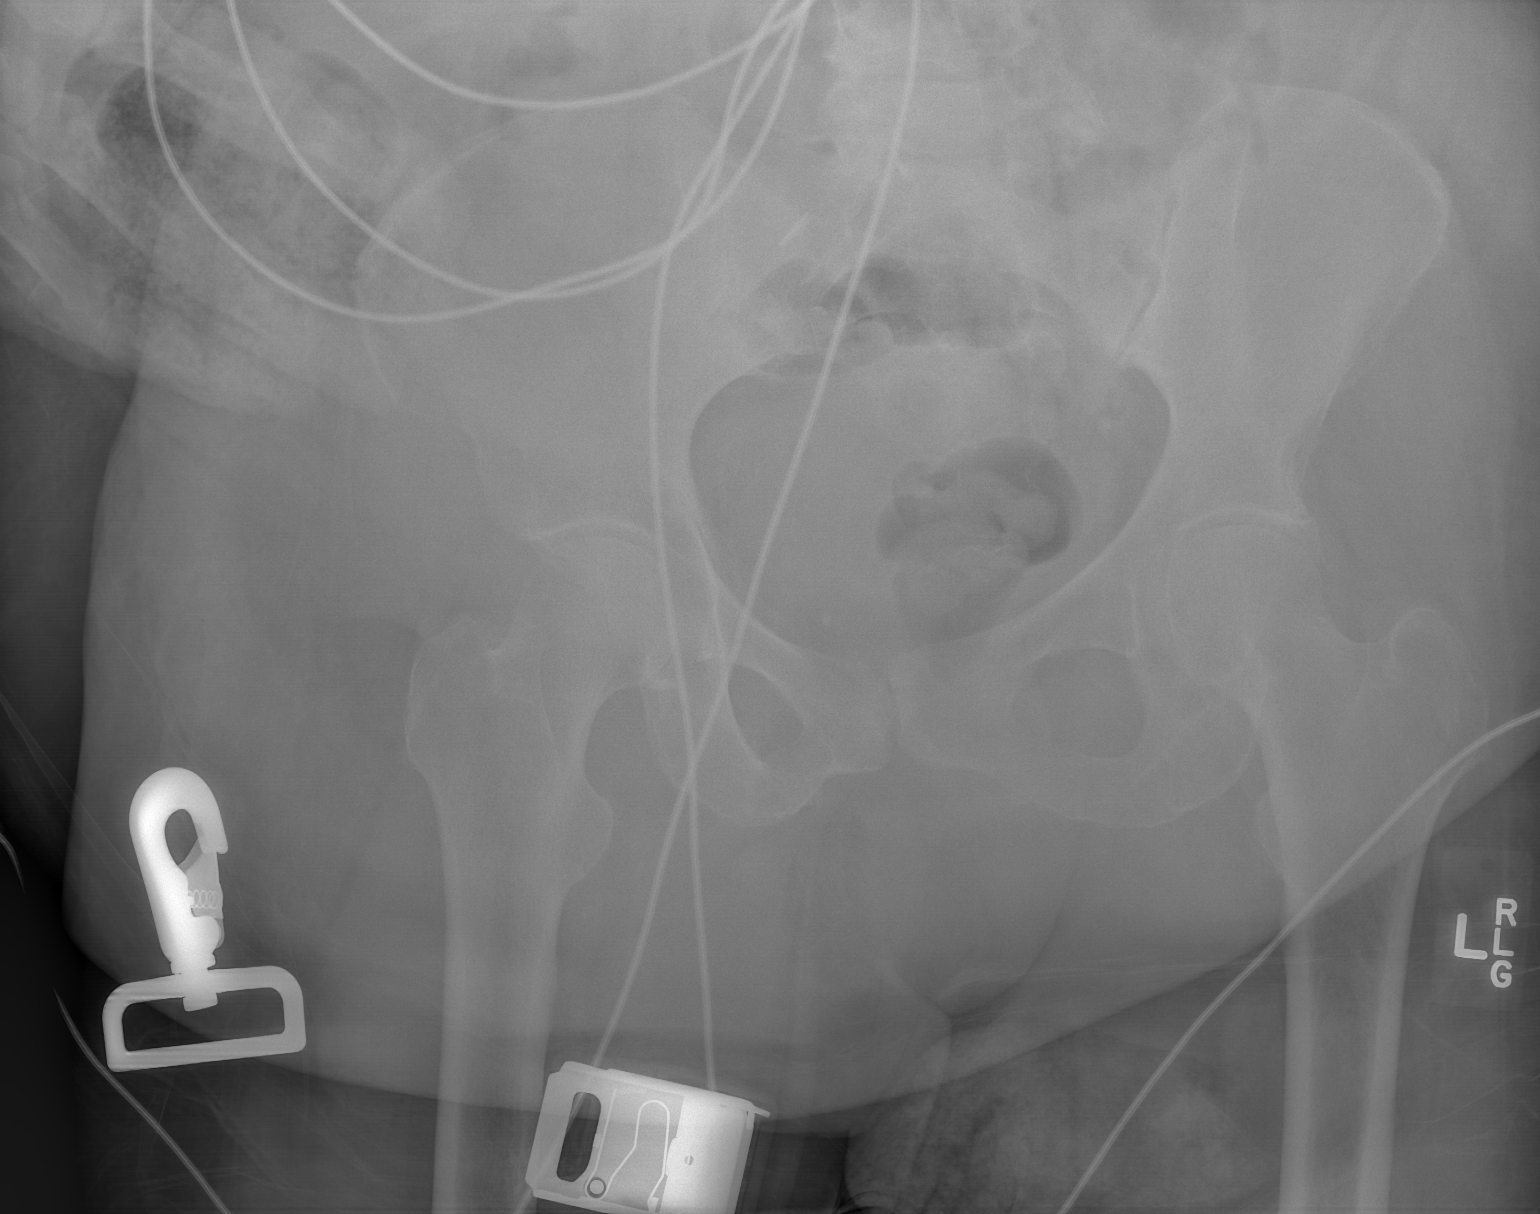

[x pelvis (2 of 2)]
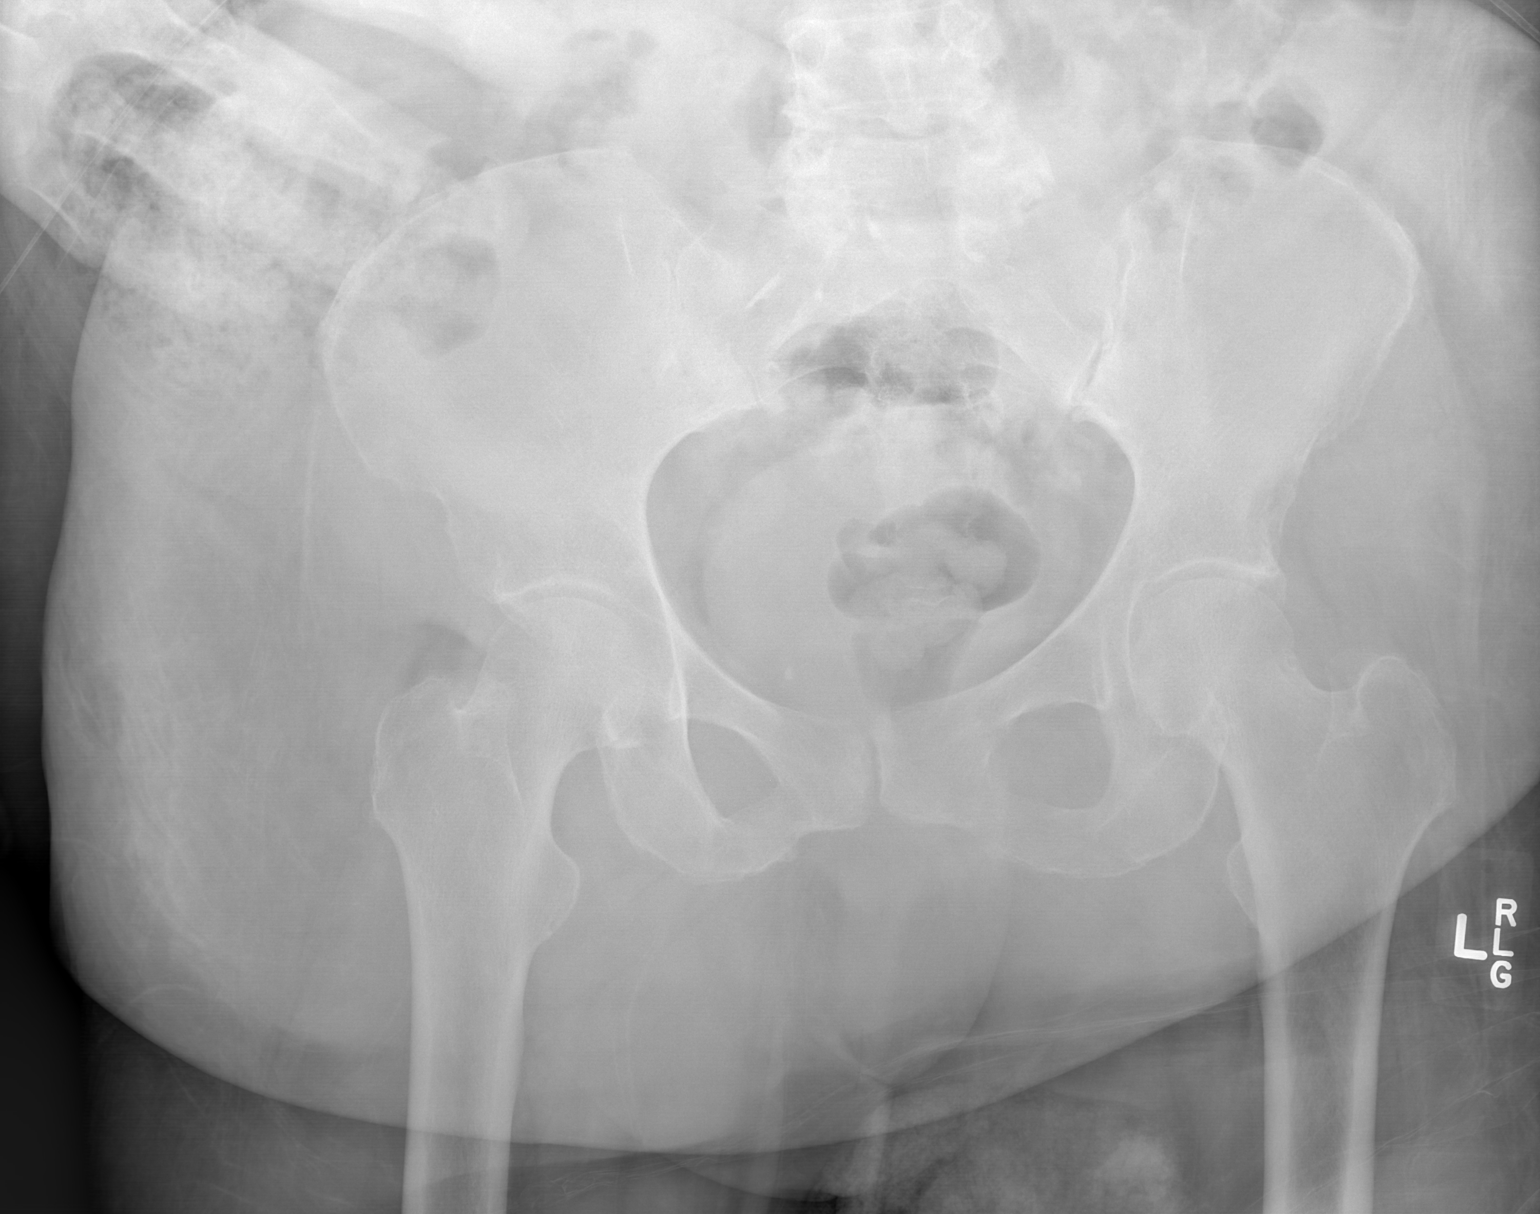

[2 of 2 positions shown; findings below may reference images not displayed]

FINDINGS: Image quality limited due to large patient size and overlying soft
tissues

Slight irregularity left femoral neck may be due to projection.
Nondisplaced fracture not excluded. If the patient has left hip
pain, further evaluation with dedicated x-rays is suggested.

Right hip is normal. No other fractures. Mild SI degenerative change
bilaterally
IMPRESSION: Mild irregularity of the left femoral neck likely due to projection.
If the patient has left hip pain, further evaluation suggested as
above..
